# Patient Record
Sex: Female | Born: 1960 | Race: White | Hispanic: No | Marital: Married | Smoking: Never smoker
Health system: Southern US, Community
[De-identification: ages and names within clinical notes are randomized; demographics above are authoritative.]

## PROBLEM LIST (undated history)

## (undated) DIAGNOSIS — Z5189 Encounter for other specified aftercare: Secondary | ICD-10-CM

## (undated) DIAGNOSIS — M1990 Unspecified osteoarthritis, unspecified site: Secondary | ICD-10-CM

## (undated) DIAGNOSIS — I219 Acute myocardial infarction, unspecified: Secondary | ICD-10-CM

## (undated) DIAGNOSIS — T7840XA Allergy, unspecified, initial encounter: Secondary | ICD-10-CM

## (undated) DIAGNOSIS — E079 Disorder of thyroid, unspecified: Secondary | ICD-10-CM

## (undated) DIAGNOSIS — IMO0001 Reserved for inherently not codable concepts without codable children: Secondary | ICD-10-CM

## (undated) DIAGNOSIS — H269 Unspecified cataract: Secondary | ICD-10-CM

## (undated) DIAGNOSIS — I509 Heart failure, unspecified: Secondary | ICD-10-CM

## (undated) HISTORY — DX: Disorder of thyroid, unspecified: E07.9

## (undated) HISTORY — DX: Acute myocardial infarction, unspecified: I21.9

## (undated) HISTORY — DX: Unspecified osteoarthritis, unspecified site: M19.90

## (undated) HISTORY — DX: Encounter for other specified aftercare: Z51.89

## (undated) HISTORY — DX: Unspecified cataract: H26.9

## (undated) HISTORY — PX: EYE SURGERY: SHX253

## (undated) HISTORY — DX: Allergy, unspecified, initial encounter: T78.40XA

## (undated) HISTORY — DX: Heart failure, unspecified: I50.9

## (undated) HISTORY — PX: BREAST SURGERY: SHX581

## (undated) HISTORY — PX: COSMETIC SURGERY: SHX468

## (undated) HISTORY — DX: Reserved for inherently not codable concepts without codable children: IMO0001

---

## 1987-06-30 HISTORY — PX: OTHER SURGICAL HISTORY: SHX169

## 1998-06-29 HISTORY — PX: STAPEDECTOMY: SHX2435

## 2000-01-21 ENCOUNTER — Encounter (INDEPENDENT_AMBULATORY_CARE_PROVIDER_SITE_OTHER): Payer: Self-pay | Admitting: Specialist

## 2000-01-21 ENCOUNTER — Other Ambulatory Visit: Admission: RE | Admit: 2000-01-21 | Discharge: 2000-01-21 | Payer: Self-pay | Admitting: Otolaryngology

## 2001-07-21 ENCOUNTER — Other Ambulatory Visit: Admission: RE | Admit: 2001-07-21 | Discharge: 2001-07-21 | Payer: Self-pay | Admitting: Obstetrics and Gynecology

## 2002-06-15 ENCOUNTER — Other Ambulatory Visit: Admission: RE | Admit: 2002-06-15 | Discharge: 2002-06-15 | Payer: Self-pay | Admitting: Obstetrics and Gynecology

## 2002-06-29 HISTORY — PX: TUBAL LIGATION: SHX77

## 2003-06-30 DIAGNOSIS — I219 Acute myocardial infarction, unspecified: Secondary | ICD-10-CM

## 2003-06-30 HISTORY — PX: CARDIAC CATHETERIZATION: SHX172

## 2003-06-30 HISTORY — DX: Acute myocardial infarction, unspecified: I21.9

## 2004-01-02 ENCOUNTER — Inpatient Hospital Stay (HOSPITAL_COMMUNITY): Admission: EM | Admit: 2004-01-02 | Discharge: 2004-01-09 | Payer: Self-pay | Admitting: Emergency Medicine

## 2004-01-03 ENCOUNTER — Encounter (INDEPENDENT_AMBULATORY_CARE_PROVIDER_SITE_OTHER): Payer: Self-pay | Admitting: Cardiology

## 2004-02-07 ENCOUNTER — Other Ambulatory Visit: Admission: RE | Admit: 2004-02-07 | Discharge: 2004-02-07 | Payer: Self-pay | Admitting: Obstetrics and Gynecology

## 2005-03-20 ENCOUNTER — Other Ambulatory Visit: Admission: RE | Admit: 2005-03-20 | Discharge: 2005-03-20 | Payer: Self-pay | Admitting: Obstetrics and Gynecology

## 2005-06-29 HISTORY — PX: CHOLECYSTECTOMY: SHX55

## 2006-06-29 DIAGNOSIS — E079 Disorder of thyroid, unspecified: Secondary | ICD-10-CM

## 2006-06-29 HISTORY — DX: Disorder of thyroid, unspecified: E07.9

## 2006-07-12 ENCOUNTER — Ambulatory Visit (HOSPITAL_COMMUNITY): Admission: RE | Admit: 2006-07-12 | Discharge: 2006-07-13 | Payer: Self-pay | Admitting: General Surgery

## 2006-07-12 ENCOUNTER — Encounter (INDEPENDENT_AMBULATORY_CARE_PROVIDER_SITE_OTHER): Payer: Self-pay | Admitting: *Deleted

## 2007-01-25 ENCOUNTER — Ambulatory Visit (HOSPITAL_COMMUNITY): Admission: RE | Admit: 2007-01-25 | Discharge: 2007-01-25 | Payer: Self-pay | Admitting: Obstetrics and Gynecology

## 2008-05-18 ENCOUNTER — Ambulatory Visit (HOSPITAL_COMMUNITY): Admission: RE | Admit: 2008-05-18 | Discharge: 2008-05-18 | Payer: Self-pay | Admitting: Obstetrics and Gynecology

## 2008-07-11 ENCOUNTER — Other Ambulatory Visit: Admission: RE | Admit: 2008-07-11 | Discharge: 2008-07-11 | Payer: Self-pay | Admitting: Interventional Radiology

## 2008-07-11 ENCOUNTER — Encounter (INDEPENDENT_AMBULATORY_CARE_PROVIDER_SITE_OTHER): Payer: Self-pay | Admitting: Interventional Radiology

## 2008-07-11 ENCOUNTER — Encounter: Admission: RE | Admit: 2008-07-11 | Discharge: 2008-07-11 | Payer: Self-pay | Admitting: General Surgery

## 2009-05-08 ENCOUNTER — Encounter: Admission: RE | Admit: 2009-05-08 | Discharge: 2009-05-08 | Payer: Self-pay | Admitting: General Surgery

## 2009-05-22 ENCOUNTER — Ambulatory Visit: Payer: Self-pay | Admitting: Obstetrics & Gynecology

## 2009-05-22 LAB — CONVERTED CEMR LAB
ALT: 11 units/L (ref 0–35)
AST: 15 units/L (ref 0–37)
Albumin: 4.4 g/dL (ref 3.5–5.2)
Calcium: 9.5 mg/dL (ref 8.4–10.5)
Cholesterol: 218 mg/dL — ABNORMAL HIGH (ref 0–200)
HCT: 41.8 % (ref 36.0–46.0)
MCV: 91.7 fL (ref 78.0–100.0)
Total CHOL/HDL Ratio: 2.2
Triglycerides: 48 mg/dL (ref <150)
VLDL: 10 mg/dL (ref 0–40)
WBC: 7.1 10*3/uL (ref 4.0–10.5)

## 2010-01-06 ENCOUNTER — Ambulatory Visit: Payer: Self-pay | Admitting: General Surgery

## 2010-02-17 ENCOUNTER — Encounter: Admission: RE | Admit: 2010-02-17 | Discharge: 2010-02-17 | Payer: Self-pay | Admitting: General Surgery

## 2010-07-20 ENCOUNTER — Encounter: Payer: Self-pay | Admitting: Obstetrics and Gynecology

## 2010-08-04 ENCOUNTER — Other Ambulatory Visit: Payer: Self-pay | Admitting: General Surgery

## 2010-08-04 DIAGNOSIS — E049 Nontoxic goiter, unspecified: Secondary | ICD-10-CM

## 2010-08-07 ENCOUNTER — Ambulatory Visit
Admission: RE | Admit: 2010-08-07 | Discharge: 2010-08-07 | Disposition: A | Discharge status: Home / Self Care | Payer: BC Managed Care – HMO | Source: Ambulatory Visit | Attending: General Surgery | Admitting: General Surgery

## 2010-08-07 DIAGNOSIS — E049 Nontoxic goiter, unspecified: Secondary | ICD-10-CM

## 2010-11-11 NOTE — Assessment & Plan Note (Signed)
NAME:  Mary Gill, Mary Gill NO.:  [id number]   MEDICAL RECORD NO.:  [id number]          PATIENT TYPE:  POB   LOCATION:  [facility] at [facility]         FACILITY:  [facility]   PHYSICIAN:  Allie Bossier, MD        DATE OF BIRTH:  15-Nov-1960   DATE OF SERVICE:  05/22/2009                                  CLINIC NOTE   Mary Gill is a 50 year old married white gravida 1, para 1.  She has a 36-  year-old daughter, who is pregnant with her first grandchild.  She comes  in here for a GYN exam.  Her only complaint is that of decreased libido.  She says her libido does not match her husband's.  She is interested in  trying testosterone ointment.  In addition, she says she has been  skipping periods, in fact she had 3 periods last year.  She had a year's  worth of hot flashes, but these have now resolved.   PAST MEDICAL HISTORY:  She was diagnosed with a myocardial infarction in  2005.  She has not seen a doctor for this condition since approximately  2006.  She had a goiter diagnosed in 2008 and saw Dr. Kae Heller, but she  has not seen him recently.  She is supposed to follow up annual exam.   REVIEW OF SYSTEMS:  As above.  Remainder of review of systems questions  are negative.  Her mammogram and Pap smear both done in 2009, and she  has not had a colonoscopy.   MEDICATIONS:  None.   PAST SURGICAL HISTORY:  She had a cardiac cath, cholecystectomy, tubal  ligation, and breast reduction.   SOCIAL HISTORY:  She drinks alcohol socially, approximately 4-5 times a  week, but no known drugs or tobacco.   ALLERGIES:  No known drug allergies.  No latex allergies.   FAMILY HISTORY:  Positive for a maternal aunt having breast cancer,  first diagnosed this was premenopausal and the second diagnosed breast  cancer was postmenopausal.   PHYSICAL EXAMINATION:  VITAL SIGNS:  Weight 185, blood pressure 124/89,  pulse 65.  HEENT:  Normal.  HEART:  Regular rate and rhythm.  BREASTS:  Normal  bilaterally.  LUNGS:  Clear to auscultation bilaterally.  ABDOMEN:  Benign.  No palpable hepatosplenomegaly.  PELVIC:  External genitalia, no lesions.  Cervix, no abnormalities.  Uterus, normal size and shape, anteverted.  Adnexa, nontender, no  masses.   ASSESSMENT AND PLAN:  Annual exam.  Checked Pap smear.  Recommended self-  breast and self-vulvar exams and I will schedule a mammogram.  With  regard to her history of goiter, I am checking a TSH today.  With regard  to  her MI, I am checking a CMET and fasting lipids.  With regard to her  decreased libido, I have given her 2% testosterone ointment to be used 3  times a week.      Allie Bossier, MD     MCD/MEDQ  D:  05/22/2009  T:  05/23/2009  Job:  [id number]

## 2010-11-14 IMAGING — US US SOFT TISSUE HEAD/NECK
1 series · 13 of 25 positions shown · non-contrast
Comparison: Thyroid ultrasound 01/25/2007.

CLINICAL DATA: Follow up multinodular goiter.

THYROID ULTRASOUND 05/18/2008:
TECHNIQUE: Ultrasound examination of the thyroid gland and
adjacent soft tissues was performed.

[Series 1: unknown · 0.09mm/px · 13 of 48 slices shown]
[im 1/48]
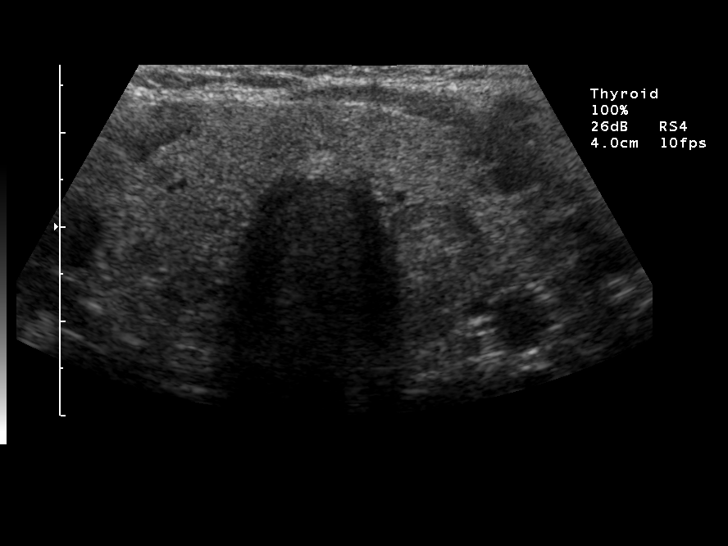
[im 4/48]
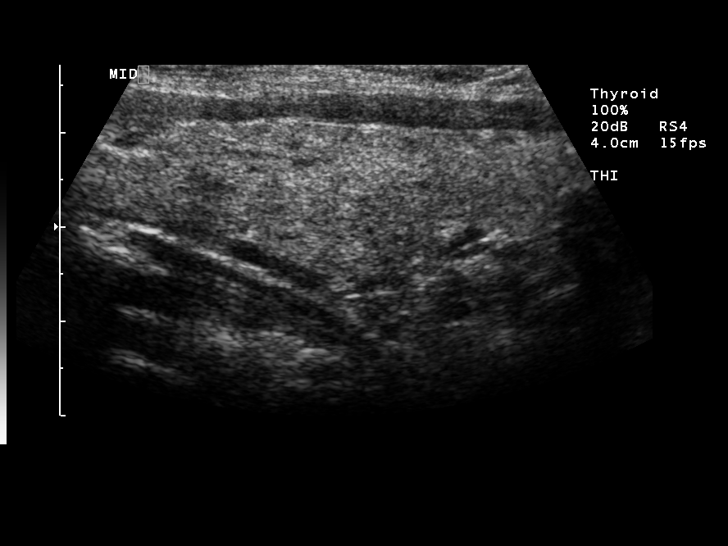
[im 8/48]
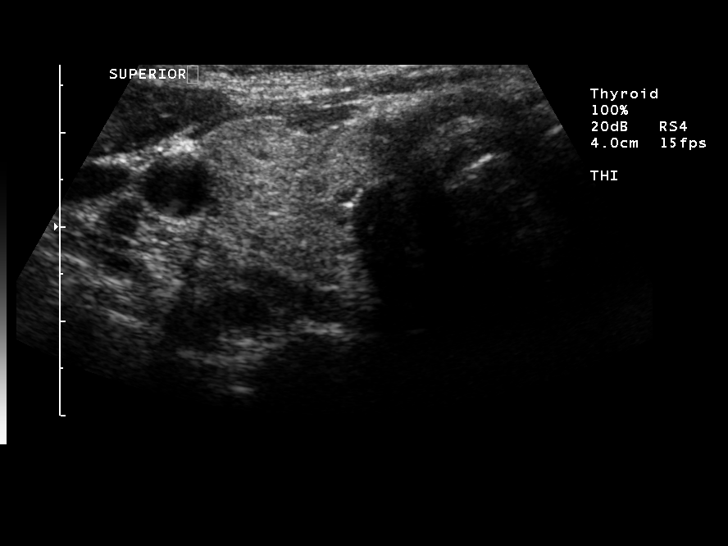
[im 12/48]
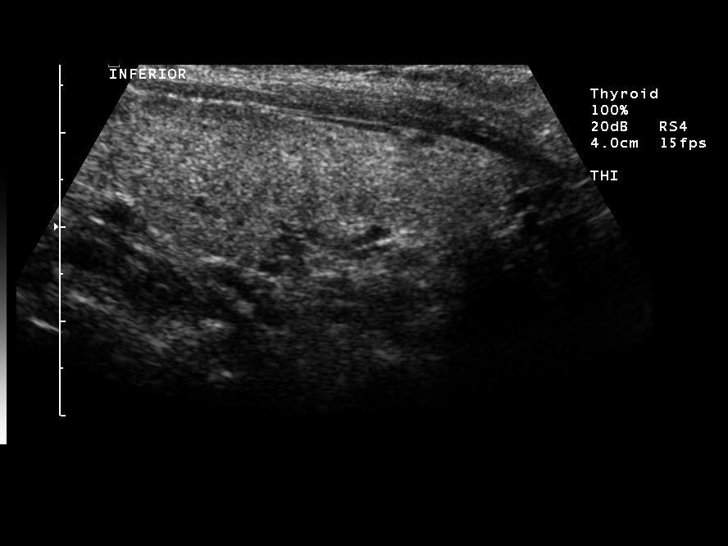
[im 16/48]
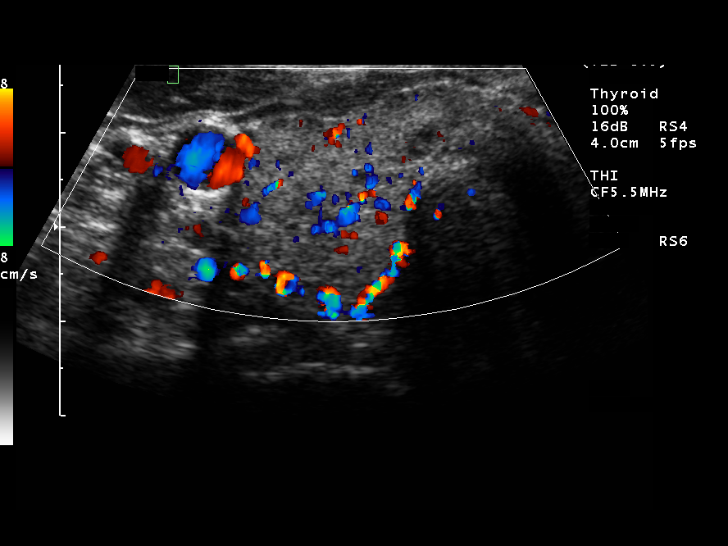
[im 20/48]
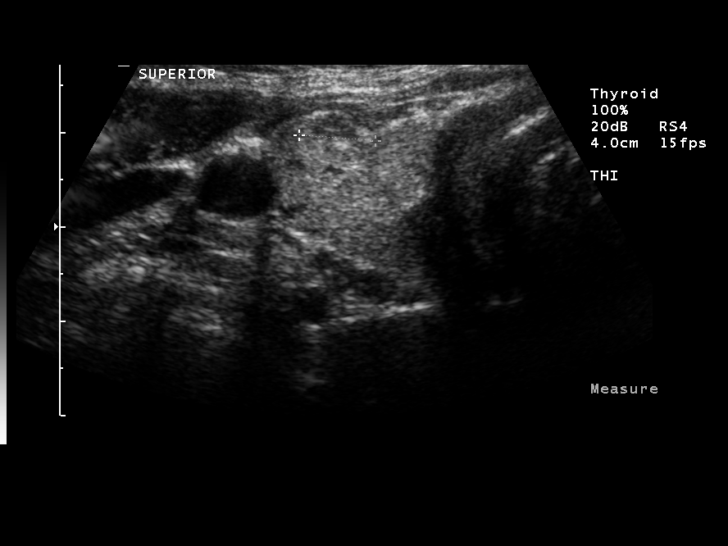
[im 24/48]
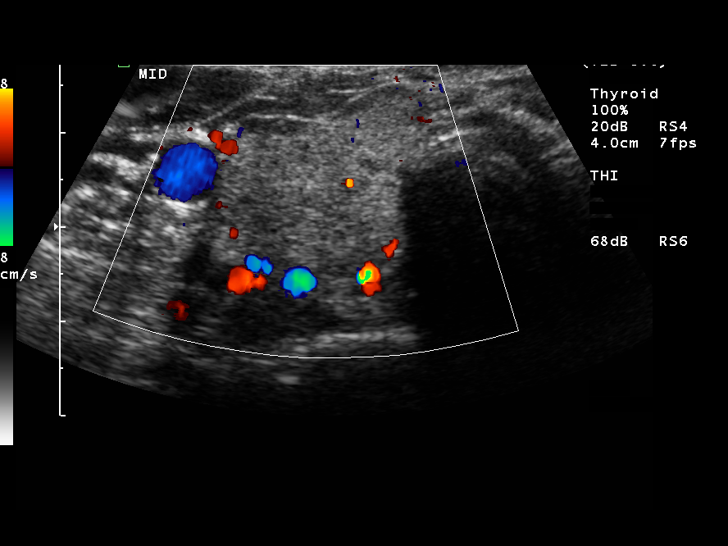
[im 28/48]
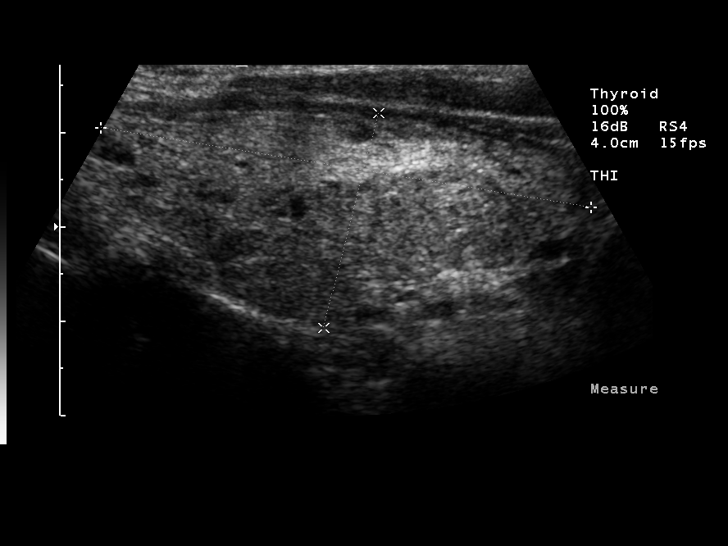
[im 32/48]
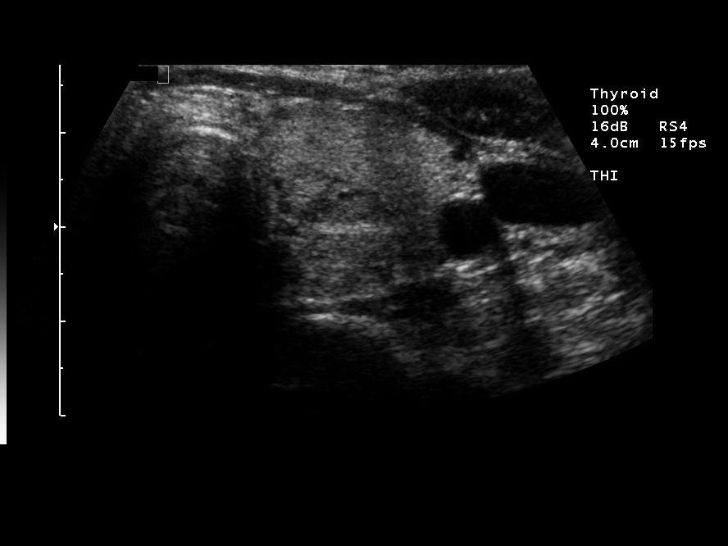
[im 36/48]
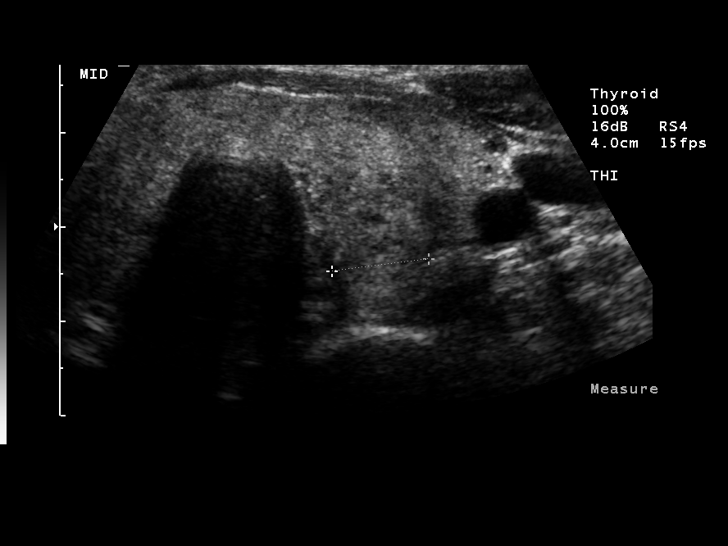
[im 40/48]
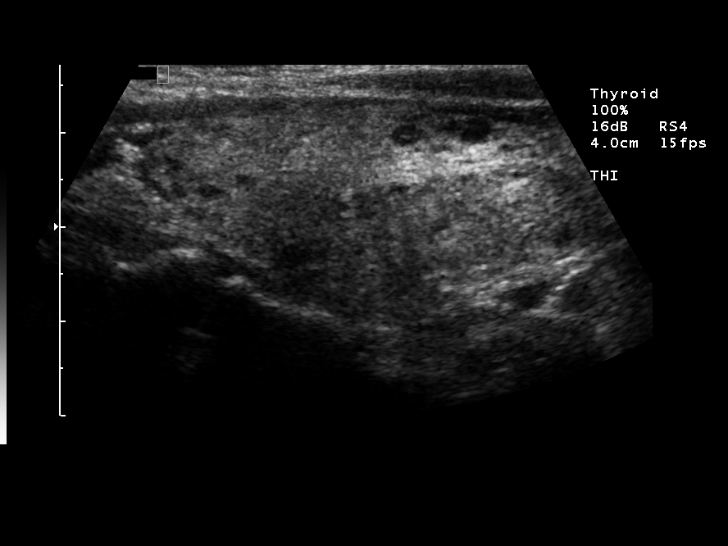
[im 44/48]
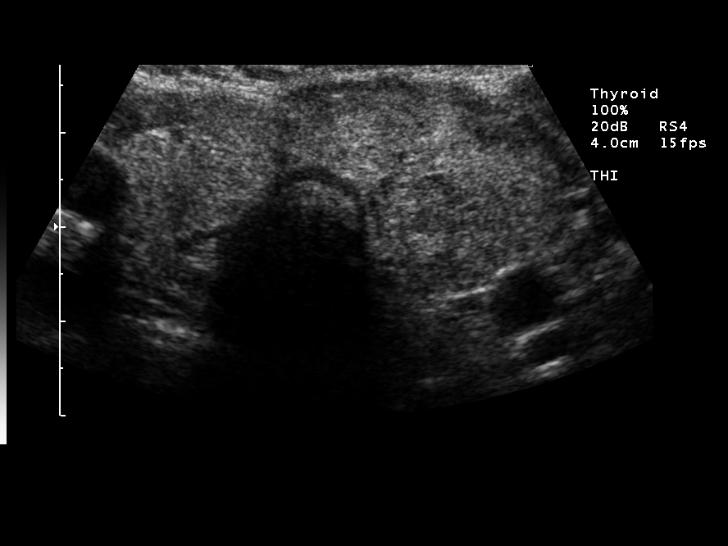
[im 48/48]
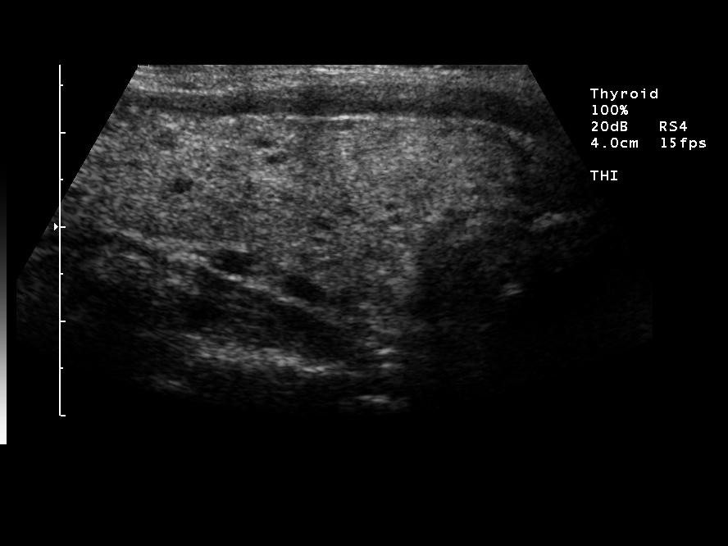

[13 of 25 positions shown; findings below may reference images not displayed]

FINDINGS: Both lobes of the thyroid gland are enlarged with
diffusely heterogeneous echotexture, and have increased slightly in
size since the previous examination; the right lobe now measuring
approximately 6.0 x 2.1 x 2.2 cm and the left lobe now measuring
approximately 5.3 x 2.4 x 2.2 cm.  The isthmus is enlarged
measuring 0.8 cm in thickness.

The largest nodule arising from the lower pole of the left lobe of
the gland has increased slightly in size since the prior study,
currently measuring approximately 2.3 x 1.2 x 1.5 cm (previously
2.0 x 1.0 x 1.3 cm).  An adjacent smaller nodule in the lower pole
of the left lobe is unchanged, currently measuring 1.4 x 0.9 x
cm.  A third measurable nodule in the upper pole of the right lobe
is unchanged, currently measuring approximately 0.8 x 0.6 x 0.8 cm.
No new nodules are identified.
IMPRESSION: 1.  Interval slight increase in size of a dominant left lower pole
thyroid nodule since the previous ultrasound from December 2006.
While this is still statistically likely benign, its interval
growth might lead one to consider biopsy.  Nuclear medicine thyroid
imaging might also be considered to determine if this is a
hyperfunctioning (cold) nodule which would prompt biopsy.
2.  Slight overall increase in size of the thyroid gland since the
prior ultrasound.
3.  2 other discrete thyroid nodules stable since the prior
ultrasound.

## 2010-11-14 NOTE — Consult Note (Signed)
NAMEMarland Gill  PASHA, GADISON                          ACCOUNT NO.:  [id number]   MEDICAL RECORD NO.:  [id number]                   PATIENT TYPE:  INP   LOCATION:  2907                                 FACILITY:  [facility]   PHYSICIAN:  Gabrielle Dare. Janee Morn, M.D.             DATE OF BIRTH:  01/29/1961   DATE OF CONSULTATION:  01/03/2004  DATE OF DISCHARGE:                                   CONSULTATION   REASON FOR CONSULTATION:  Retroperitoneal hematoma.   HISTORY OF PRESENT ILLNESS:  The patient is a 50 year old white female who  was admitted January 02, 2004, with an acute myocardial infarction.  She  subsequently underwent cardiac catheterization via her right groin. The  patient developed a right groin hematoma afterwards and had an episode of  decreased blood pressure. CT scan of the abdomen and pelvis was done showing  a right groin and right prevesical pelvic hematoma extending into the right  retroperitoneum with a small amount of hemoperitoneum.  The patient's  hemoglobin has been in the 10 range the past three times it was checked.  She complains of some lower abdominal pain.  There were no other complaints.   PAST MEDICAL HISTORY:  Negative.   PAST SURGICAL HISTORY:  1. Stapedectomy.  2. Breast reduction bilaterally.  3. Tubal ligation.   FAMILY HISTORY:  Brother has a heart murmur.  Father has Alzheimer's  disease.  Her mother is alive and well.   SOCIAL HISTORY:  She is married.  She does not smoke.   CURRENT MEDICATIONS:  None.   ALLERGIES:  No known drug allergies.   REVIEW OF SYMPTOMS:  Negative.  CARDIAC:  No current chest pain.  PULMONARY:  Negative.  GI:  Negative except for the lower abdominal pain as described  above.  GU:  Negative.  MUSCULOSKELETAL:  See above.   PHYSICAL EXAMINATION:  GENERAL APPEARANCE:  She is awake and alert.  VITAL SIGNS:  Temperature 98.7, blood pressure 95/50, heart rate 78,  respiratory rate 18.  HEENT:  Pupils are equal and sclera is  nonicteric.  NECK:  Supple.  LUNGS:  Clear to auscultation bilaterally with normal respiratory excursion.  CARDIOVASCULAR:  Regular rate and rhythm.  PMI is palpable in the left  chest.  ABDOMEN:  Nondistended and soft.  She has some tenderness in the right lower  quadrant medially and in the suprapubic area with no voluntary guarding.  No  peritoneal signs are present.  No organomegaly is noted.  SKIN:  Warm, dry and intact.  EXTREMITIES:  No significant edema.  Her right groin has a pressure dressing  on it with some hematoma present.  She has palpable right dorsalis pedis  pulse in her foot and her foot is warm.   LABORATORY DATA:  Hemoglobin 10.6, platelets 301 which is up slightly from  previous.  INR is 1.0.   CT scan findings are as described above.  IMPRESSION:  Right groin and retroperitoneal hematoma, status post cardiac  catheterization.  The patient has no coagulopathy.   RECOMMENDATIONS:  I agree with following up the patient's hemoglobin  serially.  There is no acute surgical intervention needed at this time.  I  feel we should avoid opening the hematoma and releasing the tamponade being  provided by the surrounding tissues.  We will follow her closely and I feel  that very likely this hematoma should stop expanding and resorb on its own.                                               [name], M.D.    BET/MEDQ  D:  01/03/2004  T:  01/03/2004  Job:  [id number]

## 2010-11-14 NOTE — Discharge Summary (Signed)
NAMEMarland Kitchen  Mary Gill, Mary Gill                          ACCOUNT NO.:  [id number]   MEDICAL RECORD NO.:  [id number]                   PATIENT TYPE:  INP   LOCATION:  2010                                 FACILITY:  [facility]   PHYSICIAN:  Darlin Priestly, M.D.             DATE OF BIRTH:  1960-10-07   DATE OF ADMISSION:  01/02/2004  DATE OF DISCHARGE:  01/09/2004                                 DISCHARGE SUMMARY   DISCHARGE DIAGNOSES:  1. Coronary artery disease, status post acute myocardial infarction during     this admission--the patient had a catheterization without any     intervention at this time.  2. Status post catheterization retroperitoneal hematoma.  No surgical     therapy was recommended at this time, followed by surgeons.  3. Hypertension.  4. Fever, resolved.  5. Anemia, stable.  6. Favorable lipid profile.  7. Mitral valve prolapse with mitral regurgitation, requires antibiotic     prophylaxis prior to dental procedures.   HISTORY OF PRESENT ILLNESS/HOSPITAL COURSE:  This is a 50 year old Caucasian  female patient of Dr. Uvaldo Rising who had no significant cardiac history and  presented to the emergency room with substernal chest pain.  The pain  started on the evening of July 5 and lasted approximately one hour until she  fell asleep but she woke up approximately at 5 o'clock in the morning on  July 6 with recurrent chest pain radiating to her left arm and persisted  throughout the day.  Eventually, the patient presented to the emergency room  at [facility] at 4 p.m. on July 6 and EKG showed anterolateral Q  waves and cardiac markers were positive.  The patient with ongoing chest  pain was urgently taken to the catheterization lab for urgent cardiac  catheterization.   HOSPITAL PROCEDURES:  Cardiac catheterization was performed by Dr. Jenne Campus  on January 02, 2004.  It showed significant one-vessel coronary artery disease  involving the terminal portion of the distal first  obtuse marginal branch.  Ejection fraction was low normal with inferior and apical hypokinesis.  She  also had mitral valve prolapse with mild to moderate mitral regurgitation  but no evidence of aortic dissection or aortic regurgitation.   The patient tolerated the procedure well and was transferred in stable  condition to telemetry floor but later that same evening she developed  hypotension and right groin swelling and pain.  She was evaluated by Dr.  Alanda Amass.  Hemoglobin dropped from 15 on admission to 10.9.  Retroperitoneal bleed was suspected and the patient underwent CT of the  abdomen which confirmed the diagnosis of retroperitoneal bleed.   HOSPITAL CONSULTATIONS:  Surgical consult was requested and the patient was  seen on January 03, 2004 who felt like she did not need any surgery for hematoma  evacuation at the present time.  Hemoglobin stabilized and the hematoma was  not growing in size.  The decision was made just to continue observation and  should hemoglobin drop further or the patient develops active hypotension  and the patient develops groin pain or more evidence of hypotension then  probably surgery will be recommended at that time.   She also underwent right groin ultrasound that was negative for  pseudoaneurysm or AV fistula.   At the time when the surgeons assessed the patient, her hemoglobin was low  at 8.3.  She was given 2 units of packed red blood cell transfusion and it  increased her hemoglobin to 10.9 and 11.1 at the time of discharge.  She was  ambulating in the hall with no difficulties and on January 08, 2004 she was  assessed by Dr. Elsie Lincoln and was deemed to be stable for discharge home except  her fever and a urinalysis was ordered.  Chest x-ray did not show any lung  disease and the decision was made to wait the next 24 hours for the results  of urinalysis.  The patient was empirically started on Septra for complaints  of dysuria and also on Pyridium.   The next day, January 09, 2004, she was  assessed by Dr. Jacinto Halim, did not have any complaints, dysuria resolved.  She  was slightly hypotensive.  Her blood pressure manually was 102/70 but she  ambulated down the hall with no difficulties and having recurrent chest  pain, did not have any  problems with ambulation because of the groin  discomfort.   HOSPITAL LABORATORIES:  Hemoglobin at the time of discharge was 11.1,  hematocrit 32.3, white blood cell count 12.8, platelets 314.  Sodium 136,  potassium 4.1, CO2 26, chloride 104, BUN 15, creatinine 108, glucose 101.  The first urine culture showed multiple species, probable contamination, and  no uropathogens and the second specimen was collected that showed no growth.   A chest x-ray did not show any active process, no acute lung disease, and  her fever resolved.   DISCHARGE MEDICATIONS:  1. Toprol XL 25 mg 1/2 pill daily.  2. Altace 2.5 mg 1 capsule daily.  3. Zocor 20 mg 1 pill daily.  4. Aspirin 81 mg q.d.   ACTIVITY:  No driving, no heavy lifting greater than 5 pounds, and no  strenuous activity for 72 hours post discharge.  The patient also was  instructed to continue without any exertional activity up until seen in the  office by Dr. Jenne Campus and instructed to report any increased pain, swelling,  or redness of the groin or abdominal discomfort to our office, phone number  provided.  Dr. Jenne Campus will see the patient on August 3 at 11 o'clock.   Dr. Jacinto Halim during the pre-discharge assessment of the patient expressed  suspicion that the myocardial infarction in this menstruating young female  could be caused by spontaneous dissection of a small coronary vessel and  despite her relative hypotension decision was made to continue with a low  dose of beta-blocker therapy, low dose of ACE inhibitor, and a low dose of  statin agent.  As mentioned above, she will be assessed by Dr. Jenne Campus in August after discharge from the hospital and all  necessary recommendations  will be made at that time.      Raymon Mutton, P.A.                    Darlin Priestly, M.D.    MK/MEDQ  D:  01/09/2004  T:  01/09/2004  Job:  [id number]  cc:   [facility]   Dr, Uvaldo Rising

## 2010-11-14 NOTE — Cardiovascular Report (Signed)
NAME:  Mary Gill, Mary Gill                          ACCOUNT NO.:  [id number]   MEDICAL RECORD NO.:  [id number]                   PATIENT TYPE:  INP   LOCATION:  1823                                 FACILITY:  [facility]   PHYSICIAN:  Darlin Priestly, M.D.             DATE OF BIRTH:  04-Apr-1961   DATE OF PROCEDURE:  01/02/2004  DATE OF DISCHARGE:                              CARDIAC CATHETERIZATION   PROCEDURES:  1. Left heart catheterization.  2. Coronary angiography.  3. Left ventriculogram.  4. Ascending aortography.   OPERATOR:  Darlin Priestly, M.D.   COMPLICATIONS:  None.   INDICATIONS:  [name] is a 50 year old female patient of Dr. Corliss Blacker  with no significant prior cardiac history.  The patient developed substernal  chest pain on the evening of January 01, 2004, which lasted approximately one  hour until she fell asleep.  She awoke at approximately 0500 on January 02, 2004, with recurrent chest pain radiating to her left arm, which persisted  throughout the day.  She ultimately presented to the ER at approximately 4  p.m. on January 02, 2004, with inferolateral Q-waves, ongoing chest pain, and  positive markers.  She is now brought for urgent catheterization to assess  her coronary status.   DESCRIPTION OF PROCEDURE:  After getting informed written consent, the  patient was brought to the cardiac catheterization lab.  The right and left  groin were shaved, prepped and draped in the usual sterile fashion.  Anesthesia monitoring established.  Using modified Seldinger technique, a #7  French arterial sheath was inserted in the right femoral artery.  A 5 French  venous sheath was inserted in the right femoral vein.  Six French diagnostic  catheters then used to perform diagnostic angiography.   1. There was a large left main with no significant disease.  2. The LAD is a large vessel that coursed to the apex with no significant     disease.  3. The left coronary ostium gives rise to a  large ramus intermedius, which     bifurcates in the midsegment and has no significant disease.  4. Left circumflex is a large vessel that coursed to the AV groove and gives     rise to one large obtuse marginal branch.  The AV circumflex has no     significant disease.  The first OM was a large vessel which bifurcates in     the midsegment into an upper and lower branch.  There is no significant     disease in the proximal portion of the OM.  The upper bifurcation appears     normal.  The lower bifurcation appears to have a 99% totally-occluded     distal aspect with very faint bridging collaterals to a very small distal     portion of the OM.  5. The right coronary artery is a large vessel that is dominant.  It gives     rise to a PDA as well as a posterolateral branch.  There is no     significant disease in the RCA, PDA, or posterolateral branch.  6. Left ventriculogram reveals a low normal EF of 50% with inferior and     apical hypokinesis.  7. There does appear to be mitral valve prolapse with mild to moderate     mitral regurgitation.  8. Ascending aortography reveals no evidence of dissection or aortic     regurgitation.   HEMODYNAMICS:  Systemic arterial pressure 111/73, LV __________ pressure  102/9, LVEDP of 21.   CONCLUSION:  1. Significant one-vessel coronary artery disease involving the terminal     portion of the distal first obtuse marginal.  2. Low normal ejection fraction with inferior and apical hypokinesis.  3. Mitral valve prolapse with mild to moderate mitral regurgitation.  4. No evidence of aortic dissection or aortic regurgitation.                                               Darlin Priestly, M.D.    RHM/MEDQ  D:  01/02/2004  T:  01/03/2004  Job:  [id number]   cc:   Pam Drown, M.D.  [address]  [place]  [place] [address]  Fax: [phone]

## 2010-11-14 NOTE — Op Note (Signed)
NAMESEPTEMBER, MORMILE                ACCOUNT NO.:  [id number]   MEDICAL RECORD NO.:  [id number]          PATIENT TYPE:  AMB   LOCATION:  SDS                          FACILITY:  [facility]   PHYSICIAN:  Adolph Pollack, M.D.DATE OF BIRTH:  08-07-1960   DATE OF PROCEDURE:  07/12/2006  DATE OF DISCHARGE:                               OPERATIVE REPORT   PREOPERATIVE DIAGNOSIS:  Symptomatic cholelithiasis.   POSTOPERATIVE DIAGNOSIS:  Symptomatic cholelithiasis.   PROCEDURE:  Laparoscopic cholecystectomy with intraoperative  cholangiogram.   SURGEON:  Adolph Pollack, M.D.   ASSISTANT:  Leonie Man, MD   ANESTHESIA:  General.   INDICATION:  Ms. Faddis is a 50 year old female who has had two bouts  of biliary colic, one being quite severe.  She had an abdominal  ultrasound which demonstrated cholelithiasis and a mildly distended  gallbladder.  The common bile duct was reported be upper limits of  normal in size.  She now presents for elective cholecystectomy.  We have  discussed the procedure and risks preoperatively.   TECHNIQUE:  She is seen in the holding area and brought to the operating  room, placed supine on the operating table.  General anesthetic was  administered.  The abdominal wall was sterilely prepped and draped.  Dilute Marcaine solution was infiltrated in the subumbilical area.  A  small subumbilical incision was made dividing skin, subcutaneous tissue,  fascia and peritoneum entering the peritoneal cavity.  A pursestring  suture of 0 Vicryl was placed around the fascial edges.  A Hassan trocar  was introduced into the peritoneal cavity and pneumoperitoneum created  by insufflation of CO2 gas.   The laparoscope was introduced.  She is placed in reverse Trendelenburg  position.  The right side tilted slightly upward.  An 11-mm trocar was  placed through an epigastric incision and two 5 mm trocars placed in the  right mid lateral abdomen.  The fundus of the  gallbladder was grasped  and retracted toward the right shoulder.  The infundibulum was grasped  and mobilized by using careful dissection close to the gallbladder.  The  cystic duct was identified and a window created around it.  The cystic  artery was identified and a window created around it as well.  A clip  was placed in the cystic duct gallbladder junction.  An incision was  made in the cystic duct and bile milked back.  The cholangiocatheter was  then passed through the anterior abdominal wall and placed into the  cystic duct and a cholangiogram was performed.   Under real time fluoroscopy dilute contrast material was injected to the  cystic duct which was mild moderate to long length.  The common  hepatic/right left hepatic/common bile ducts all opacified and contrast  drained into the duodenum rapidly without obvious evidence of  obstruction.  Final reports pending radiologist's interpretation.   Cholangiocath was removed, the cystic duct was clipped three times  proximally and divided.  The cystic artery was then clipped and divided.  The gallbladder was then dissected free from the liver using  electrocautery and placed in  Endopouch bag.  The gallbladder fossa was  copiously irrigated.  No bleeding or bile leak was noted.  The  irrigation fluid was evacuated as much as possible.   The gallbladder was then removed through the subumbilical incision and  the subumbilical fascial defect was closed under laparoscopic vision by  tightening up and tying down the pursestring suture.  The remaining  trocars removed and the pneumoperitoneum released.  The skin incisions  were closed with 4-0 Monocryl subcuticular stitches followed by Steri-  Strips and sterile dressing.   She tolerated the procedure without any apparent complications and was  taken to recovery in satisfactory condition.      Adolph Pollack, M.D.  Electronically Signed     TJR/MEDQ  D:  07/12/2006  T:   07/12/2006  Job:  [id number]   cc:   Darlin Priestly, MD

## 2011-10-15 ENCOUNTER — Ambulatory Visit (INDEPENDENT_AMBULATORY_CARE_PROVIDER_SITE_OTHER): Payer: BC Managed Care – HMO | Admitting: Obstetrics & Gynecology

## 2011-10-15 ENCOUNTER — Encounter: Payer: Self-pay | Admitting: Obstetrics & Gynecology

## 2011-10-15 VITALS — BP 111/68 | HR 69 | Ht 66.0 in | Wt 180.0 lb

## 2011-10-15 DIAGNOSIS — Z124 Encounter for screening for malignant neoplasm of cervix: Secondary | ICD-10-CM

## 2011-10-15 DIAGNOSIS — Z113 Encounter for screening for infections with a predominantly sexual mode of transmission: Secondary | ICD-10-CM

## 2011-10-15 DIAGNOSIS — Z0000 Encounter for general adult medical examination without abnormal findings: Secondary | ICD-10-CM

## 2011-10-15 DIAGNOSIS — Z01419 Encounter for gynecological examination (general) (routine) without abnormal findings: Secondary | ICD-10-CM

## 2011-10-15 MED ORDER — ESTROGENS, CONJUGATED 0.625 MG/GM VA CREA: TOPICAL_CREAM | VAGINAL | Status: DC

## 2011-10-15 NOTE — Progress Notes (Signed)
Subjective:    Mary Gill is a 51 y.o. female who presents for an annual exam. The patient has no complaints today. The patient is sexually active. GYN screening history: last pap: was normal. The patient wears seatbelts: yes. The patient participates in regular exercise: yes. Has the patient ever been transfused or tattooed?: yes. The patient reports that there is not domestic violence in her life.   Menstrual History: OB History    Grav Para Term Preterm Abortions TAB SAB Ect Mult Living   1 1 1       1       Menarche age: 64 No LMP recorded. Patient is postmenopausal.    The following portions of the patient's history were reviewed and updated as appropriate: allergies, current medications, past family history, past medical history, past social history, past surgical history and problem list.  Review of Systems A comprehensive review of systems was negative. Her mammogram was normal recently. She would like her fasting labs done today.   Objective:    BP 111/68  Pulse 69  Ht 5\' 6"  (1.676 m)  Wt 180 lb (81.647 kg)  BMI 29.05 kg/m2  General Appearance:    Alert, cooperative, no distress, appears stated age  Head:    Normocephalic, without obvious abnormality, atraumatic  Eyes:    PERRL, conjunctiva/corneas clear, EOM's intact, fundi    benign, both eyes  Ears:    Normal TM's and external ear canals, both ears  Nose:   Nares normal, septum midline, mucosa normal, no drainage    or sinus tenderness  Throat:   Lips, mucosa, and tongue normal; teeth and gums normal  Neck:   Supple, symmetrical, trachea midline, no adenopathy;    thyroid:  no enlargement/tenderness/nodules; no carotid   bruit or JVD  Back:     Symmetric, no curvature, ROM normal, no CVA tenderness  Lungs:     Clear to auscultation bilaterally, respirations unlabored  Chest Wall:    No tenderness or deformity   Heart:    Regular rate and rhythm, S1 and S2 normal, no murmur, rub   or gallop  Breast Exam:    No  tenderness, masses, or nipple abnormality  Abdomen:     Soft, non-tender, bowel sounds active all four quadrants,    no masses, no organomegaly  Genitalia:    Normal female without lesion, discharge or tenderness, moderate atrophy, NSSA, NT, no adnexal masses     Extremities:   Extremities normal, atraumatic, no cyanosis or edema  Pulses:   2+ and symmetric all extremities  Skin:   Skin color, texture, turgor normal, no rashes or lesions  Lymph nodes:   Cervical, supraclavicular, and axillary nodes normal  Neurologic:   CNII-XII intact, normal strength, sensation and reflexes    throughout  .    Assessment:    Healthy female exam.  Atrophic vaginitis.   Plan:     Pap smear.  Labs as requested. Vaginal estrogen 2-3 times per week

## 2011-10-16 LAB — COMPREHENSIVE METABOLIC PANEL
Albumin: 4.3 g/dL (ref 3.5–5.2)
Alkaline Phosphatase: 77 U/L (ref 39–117)
CO2: 28 mEq/L (ref 19–32)
Glucose, Bld: 80 mg/dL (ref 70–99)
Potassium: 4.3 mEq/L (ref 3.5–5.3)
Sodium: 143 mEq/L (ref 135–145)
Total Bilirubin: 0.5 mg/dL (ref 0.3–1.2)

## 2011-10-16 LAB — TSH: TSH: 1.867 u[IU]/mL (ref 0.350–4.500)

## 2011-10-16 LAB — LIPID PANEL
Cholesterol: 171 mg/dL (ref 0–200)
LDL Cholesterol: 82 mg/dL (ref 0–99)
VLDL: 10 mg/dL (ref 0–40)

## 2012-04-14 ENCOUNTER — Encounter: Payer: Self-pay | Admitting: Obstetrics & Gynecology

## 2012-07-04 IMAGING — US US THYROID
1 series · 17 of 25 positions shown · non-contrast
Comparison: none

REASON FOR EXAM: multinodular thyroid goiter
COMMENTS:

[Series 1: us thyroid · 17 of 104 slices shown]
[im 1/104]
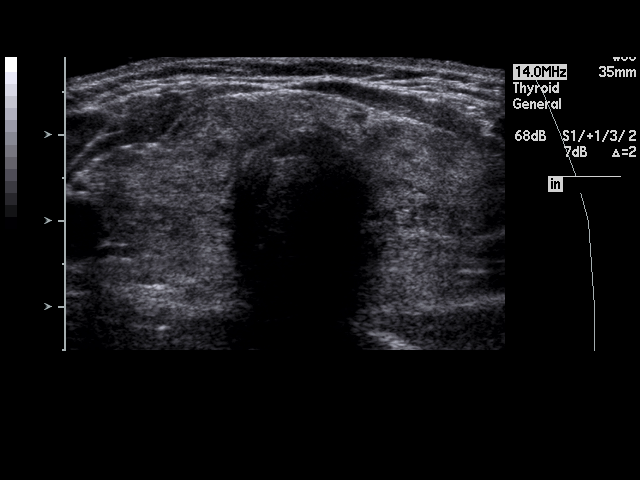
[im 9/104]
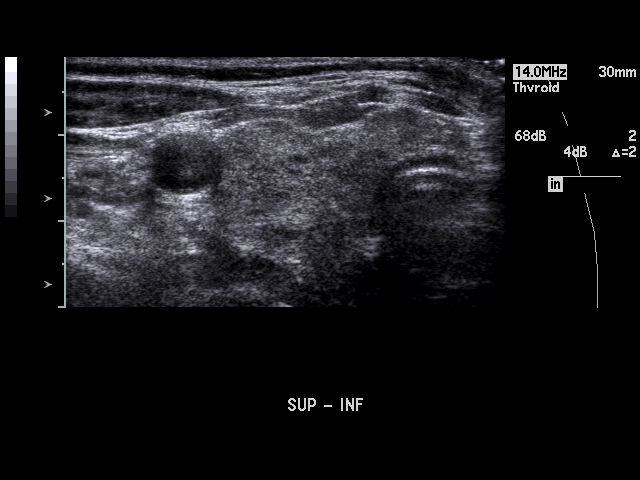
[im 13/104]
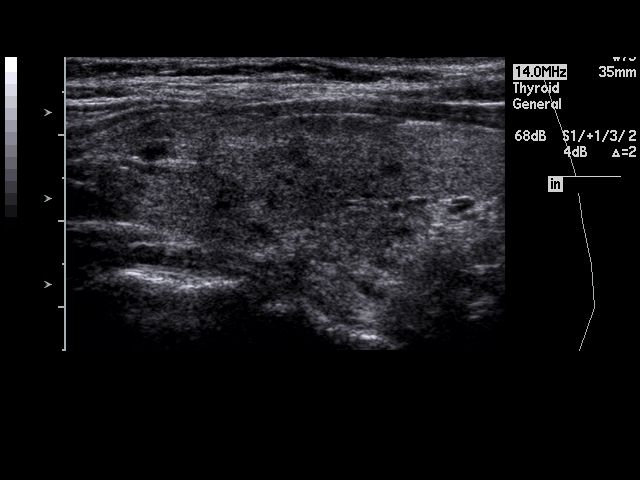
[im 22/104]
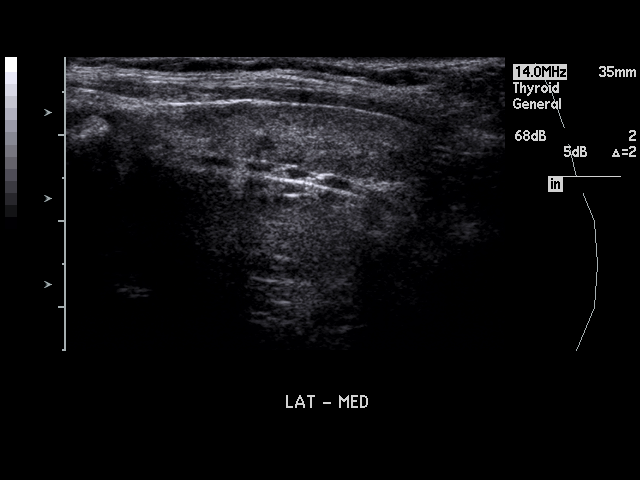
[im 26/104]
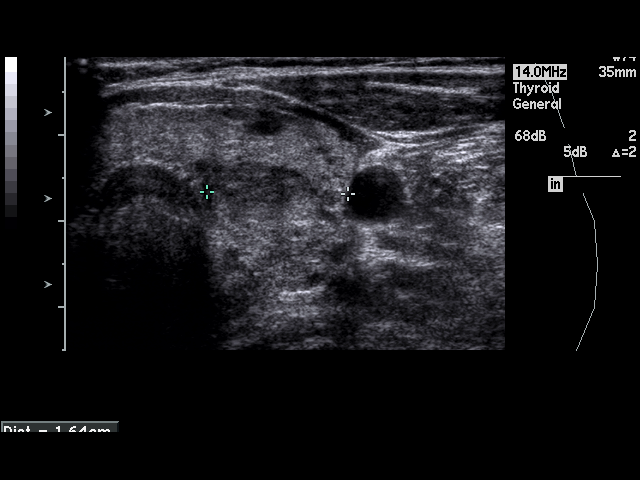
[im 35/104]
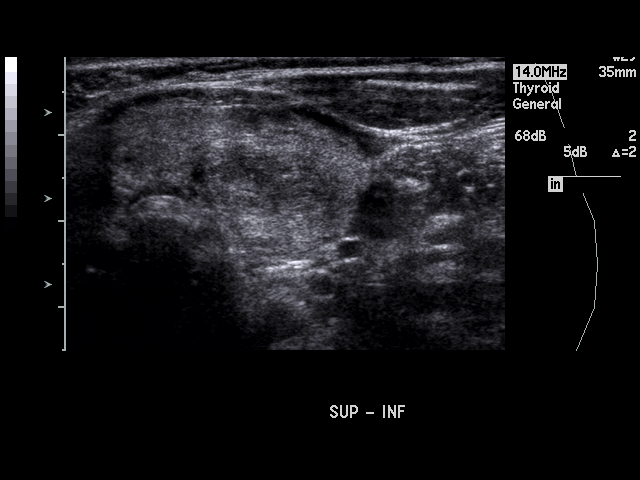
[im 39/104]
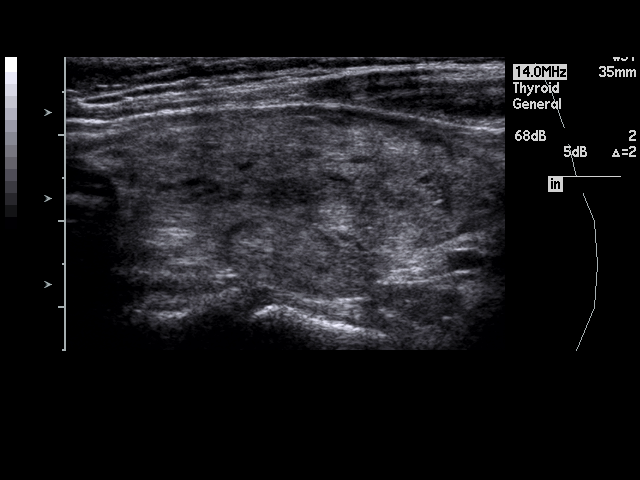
[im 48/104]
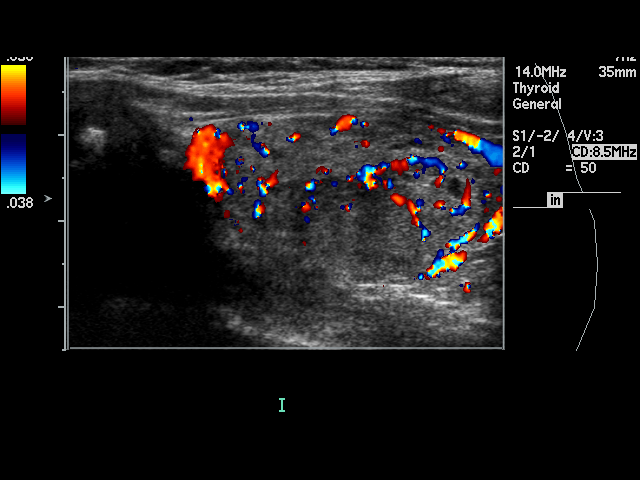
[im 52/104]
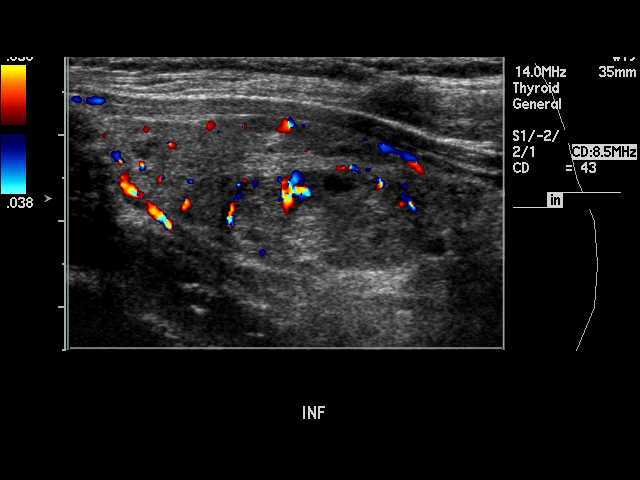
[im 56/104]
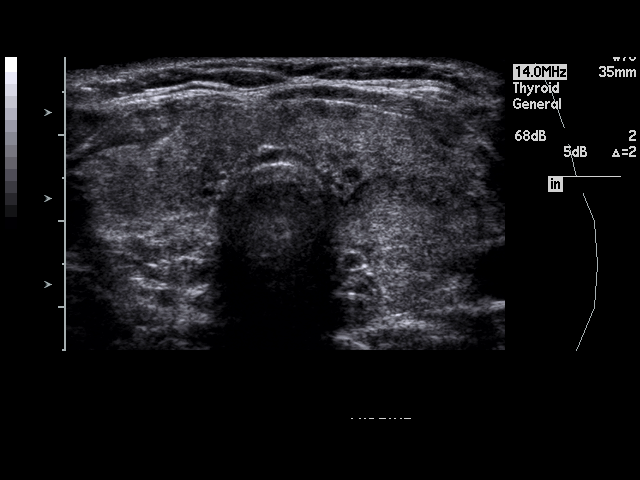
[im 65/104]
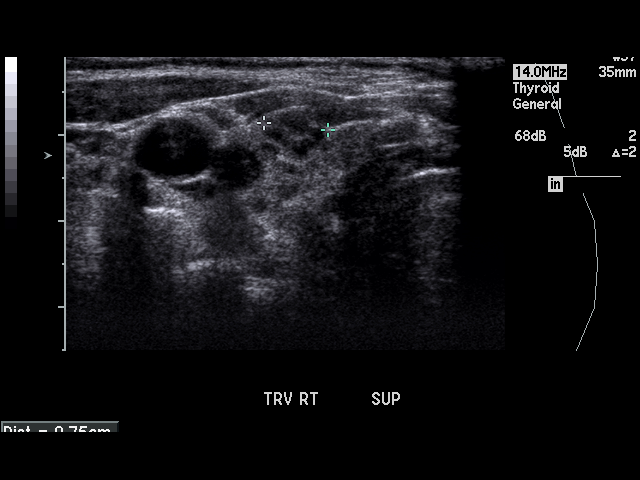
[im 69/104]
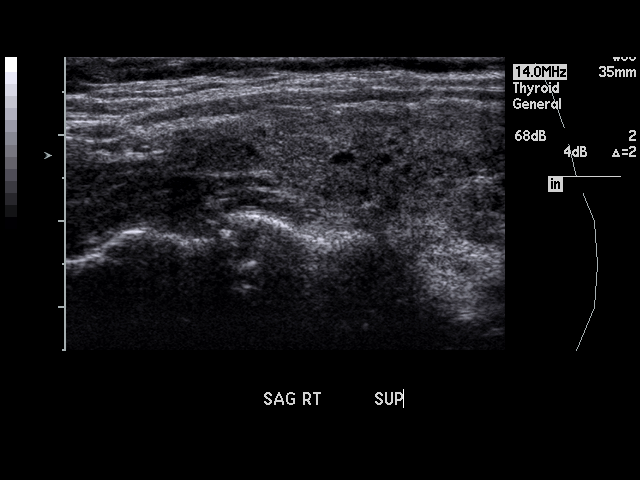
[im 78/104]
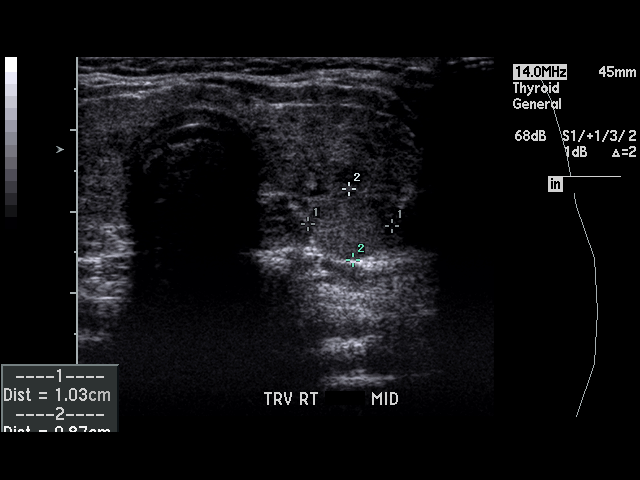
[im 82/104]
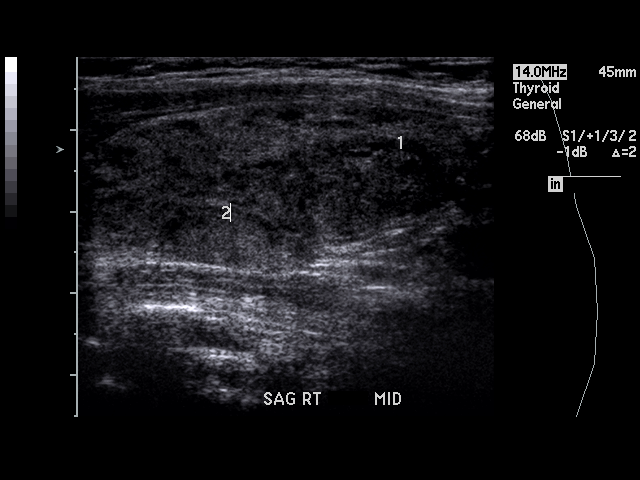
[im 91/104]
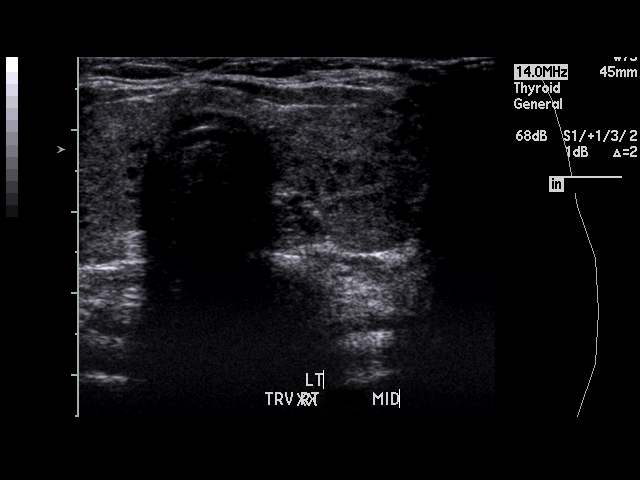
[im 95/104]
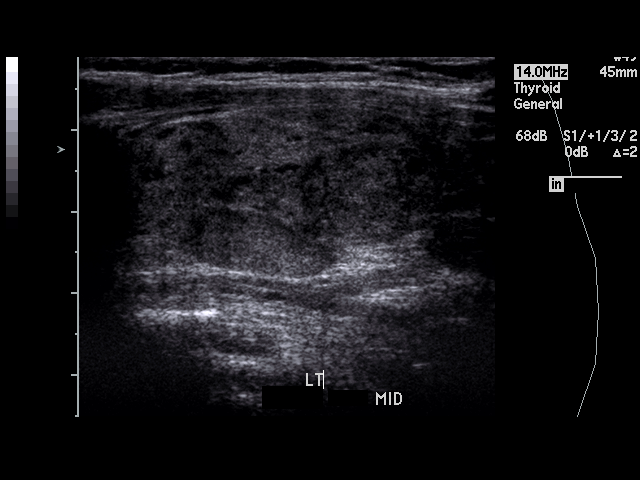
[im 104/104]
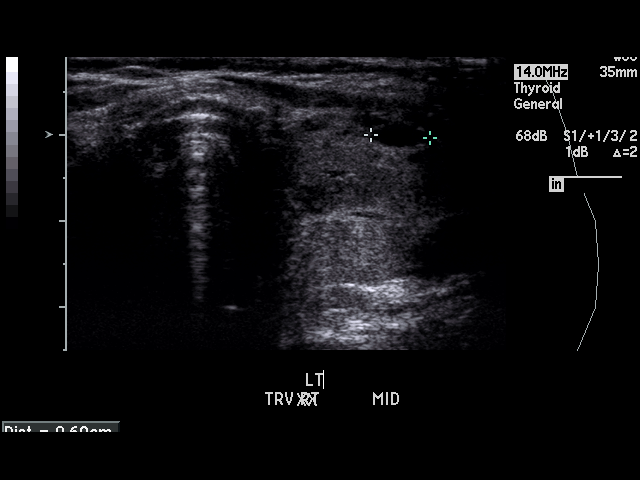

[17 of 25 positions shown; findings below may reference images not displayed]

PROCEDURE:     US  - US THYROID  - January 06, 2010  [DATE]

RESULT:     The right lobe of the thyroid measures 4.96 cm x 1.85 cm x
cm and the left lobe measures 4.4 cm x 1.73 cm x 1.64 cm. On the right,
there are three nodules. The most superior measures 1 cm in diameter and is
a complex nodule containing hypoechoic and cystic components. There is also
an 8.1 mm, mildly hypoechoic, solid nodule at the upper pole of the right
node. A tiny, 3.5 mm cystic nodule is noted in the midpole region. On the
left, there is a 2.1 cm, hypoechoic, solid nodule at the lower pole. In the
midpole region, there is a solid, mildly hypoechoic, 1.78 cm nodule that on
the initial views is mislabeled as right lobe. Corrected views are recorded
at the end of the exam. At the upper pole of the left lobe, there is a
mm, anechoic nodule compatible with a cystic nodule that measures 6.9 mm at
maximum diameter. No other nodules are identified. The thyroid echotexture
bilaterally is mildly heterogeneous. No thyroid calcifications are seen
within the nodules or within the thyroid tissue peripheral to the nodules.
IMPRESSION: Multinodular thyroid as noted above.

## 2012-08-15 IMAGING — US US SOFT TISSUE HEAD/NECK
1 series · 14 of 25 positions shown · non-contrast
Comparison: Thyroid ultrasound 05/08/2009

CLINICAL DATA: Follow-up multinodular goiter.

THYROID ULTRASOUND
TECHNIQUE: Ultrasound examination of the thyroid gland and adjacent
soft tissues was performed.

[Series 1: us soft tissue head/neck · 0.09mm/px · 14 of 55 slices shown]
[im 1/55]
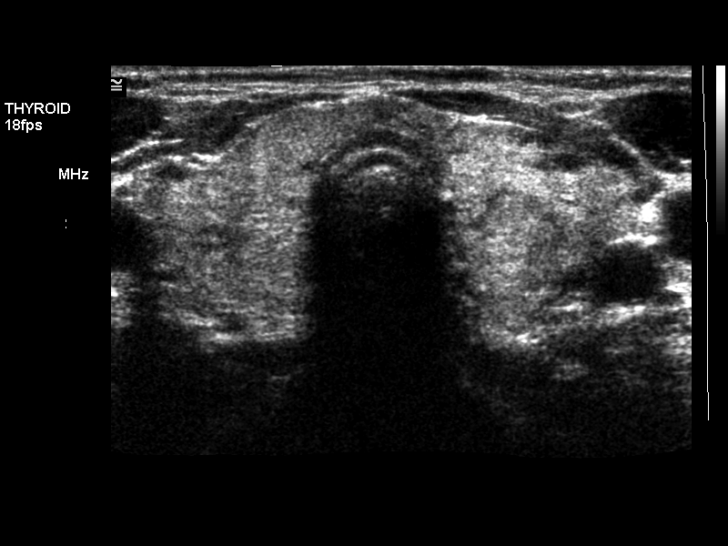
[im 5/55]
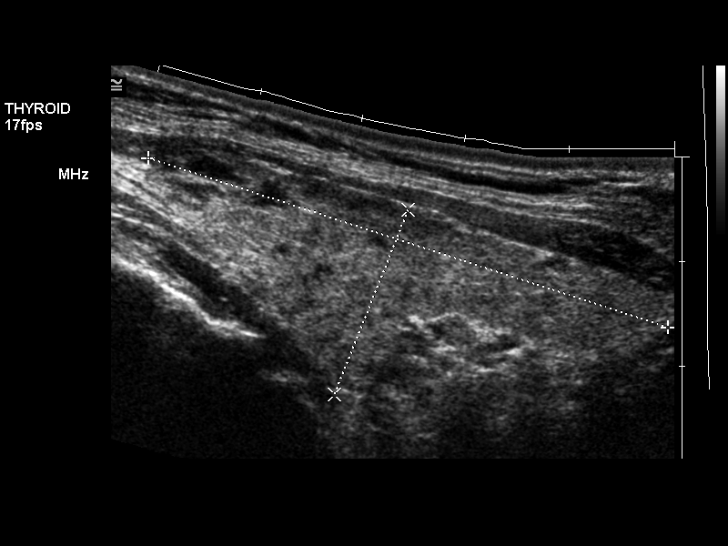
[im 10/55]
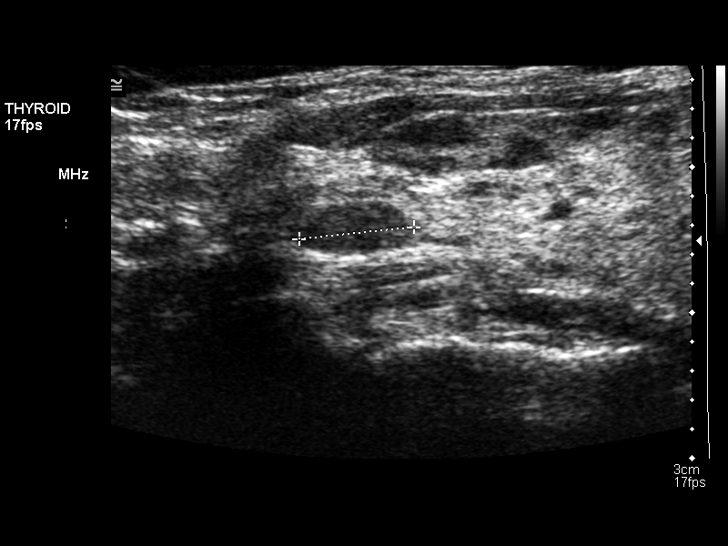
[im 14/55]
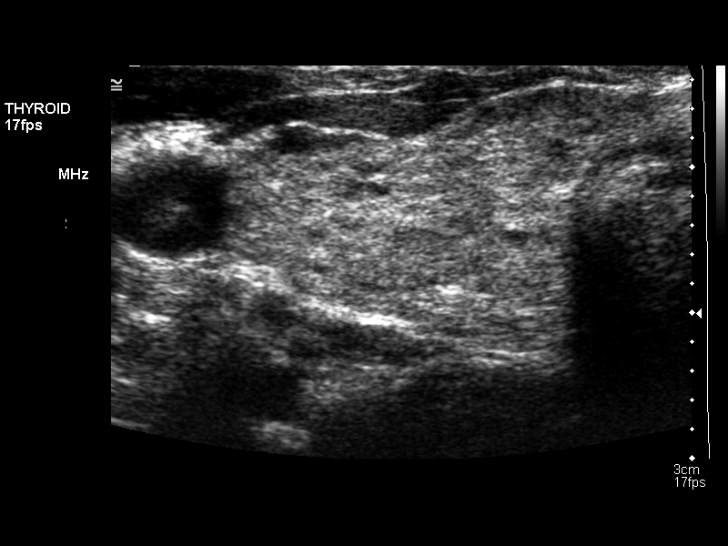
[im 19/55]
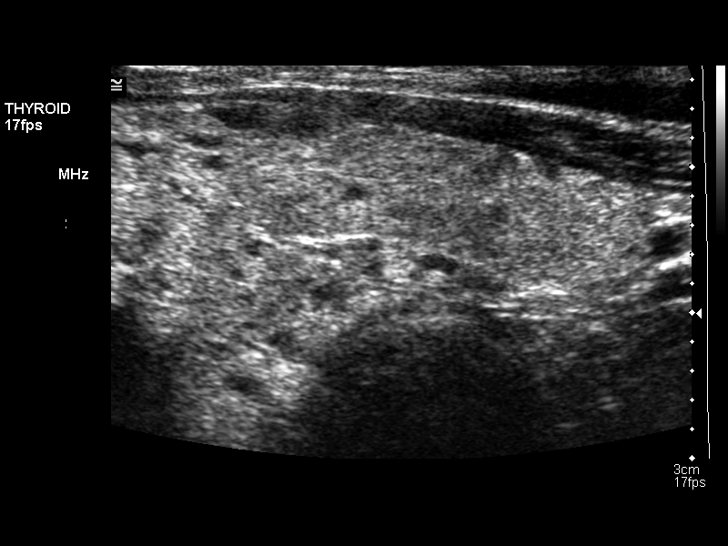
[im 21/55]
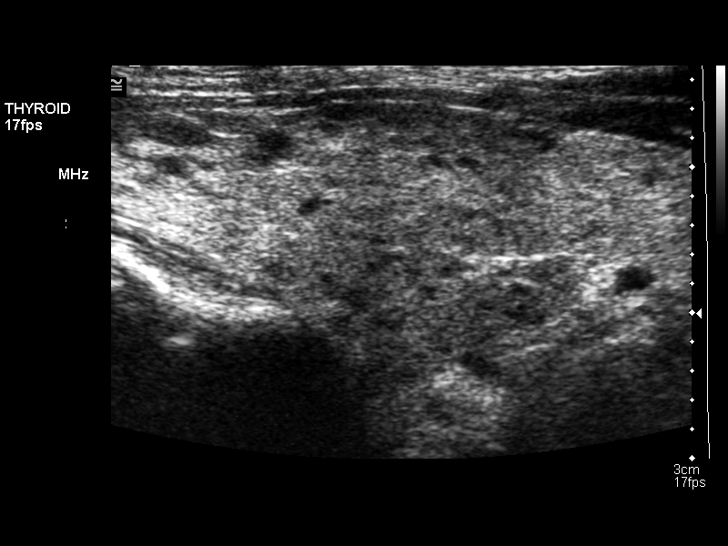
[im 25/55]
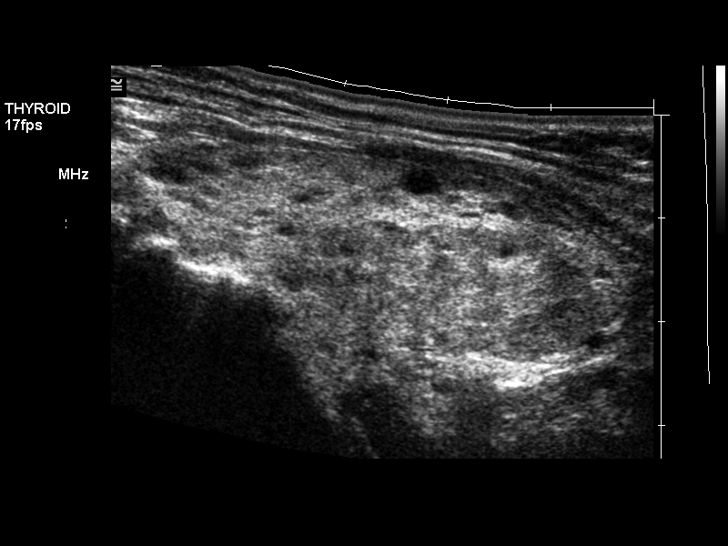
[im 30/55]
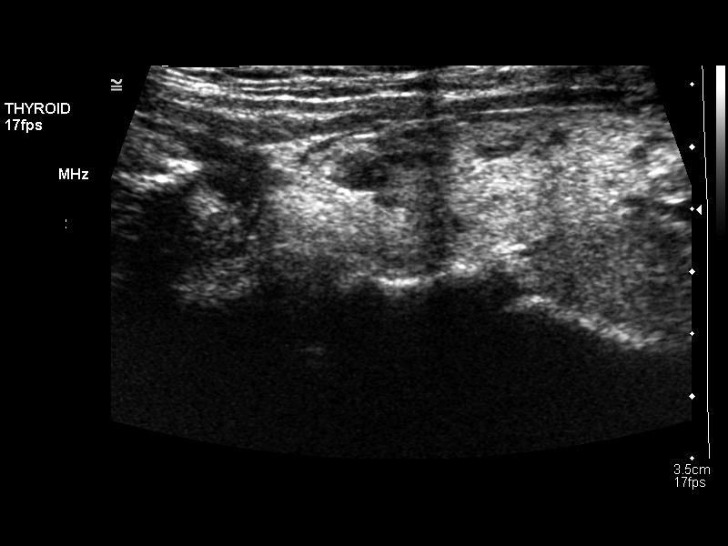
[im 34/55]
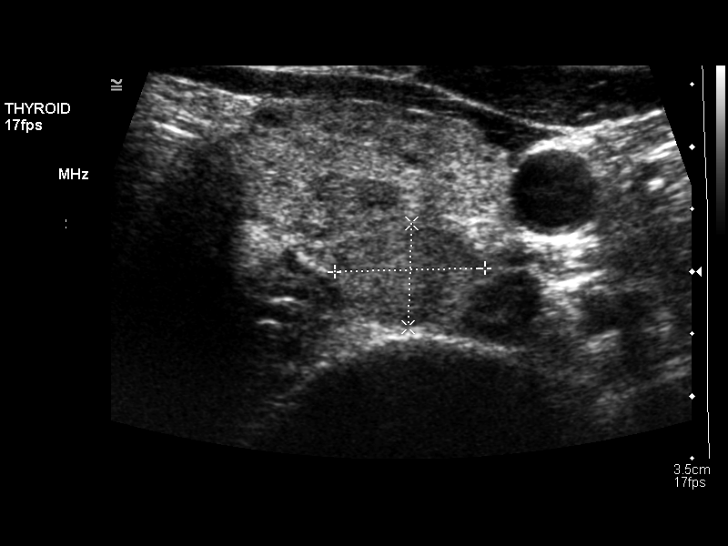
[im 37/55]
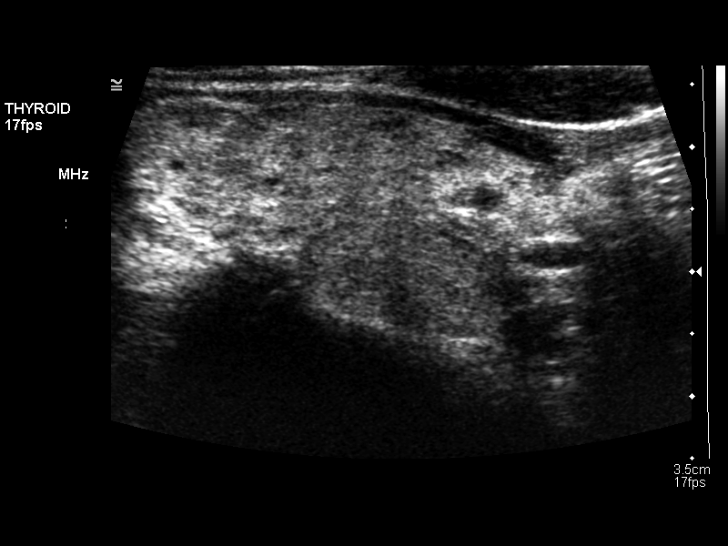
[im 41/55]
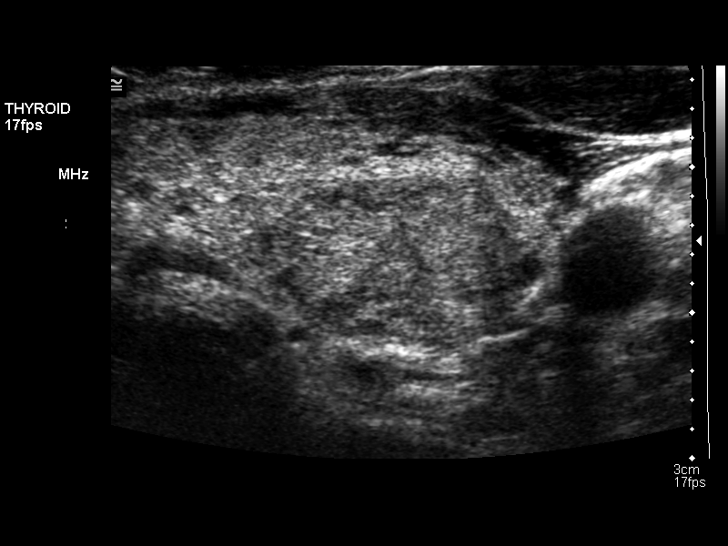
[im 46/55]
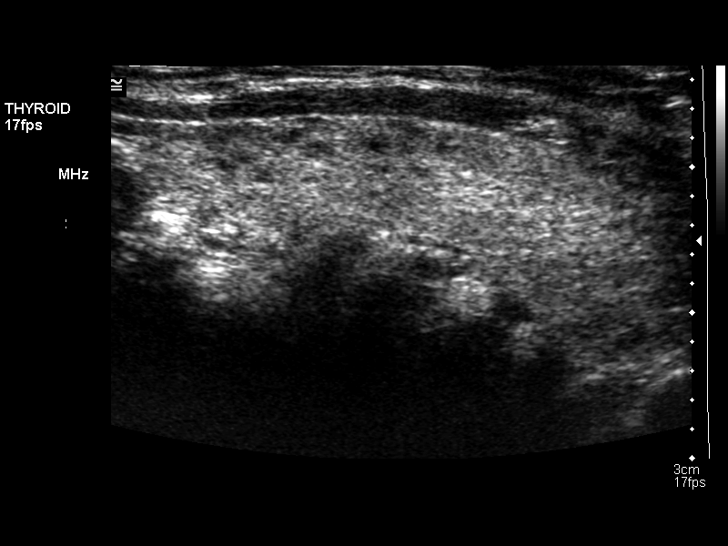
[im 50/55]
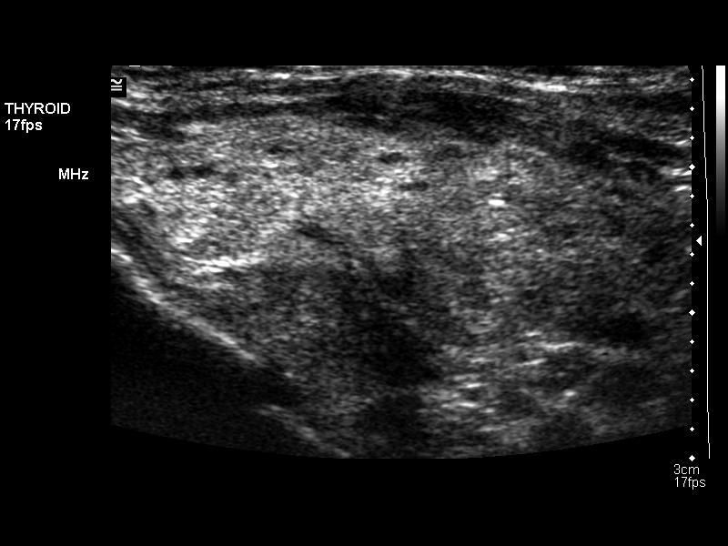
[im 55/55]
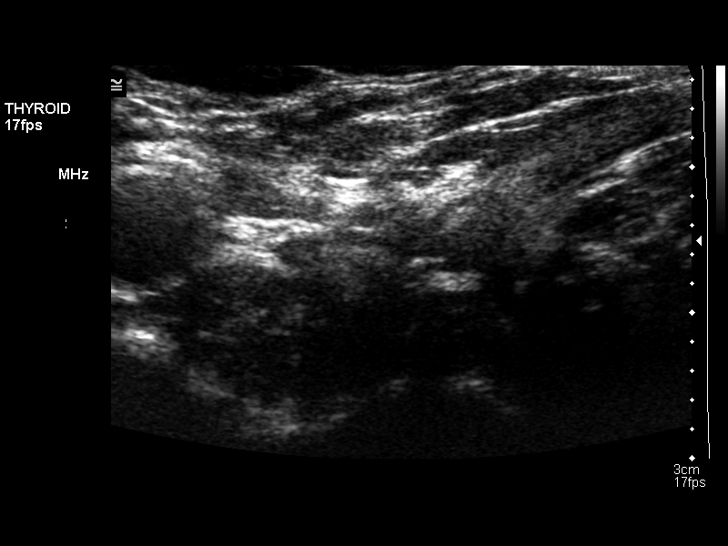

[14 of 25 positions shown; findings below may reference images not displayed]

FINDINGS: Right thyroid lobe:  Measures 5.2 x 1.9 x 2.0 cm.  Heterogeneous
echogenicity.
Left thyroid lobe:  Measures 5.3 x 1.9 x 2.4 cm.  Heterogeneous
echogenicity.
Isthmus:  0.5 cm.

Focal nodules:  There are multiple small stable thyroid nodules in
both lobes.  The largest nodules are in the inferior aspect of the
left lobe adjacent to each other.  The more superior nodule
measures 1.2 x 0.8 x 1.8 cm and the more inferior nodule measures
2.0 x 1.2 x 1.9 cm.  These are relatively stable.

Lymphadenopathy:  Absent
IMPRESSION: Stable multinodular goiter.  Recommend follow-up ultrasound
examination in 1 year.

## 2013-02-02 IMAGING — US US SOFT TISSUE HEAD/NECK
1 series · 14 of 25 positions shown · non-contrast
Comparison: 02/17/2010

CLINICAL DATA: Thyroid goiter.

THYROID ULTRASOUND
TECHNIQUE: Ultrasound examination of the thyroid gland and adjacent
soft tissues was performed.

[Series 1: us soft tissue head/neck · 0.06mm/px · 14 of 50 slices shown]
[im 1/50]
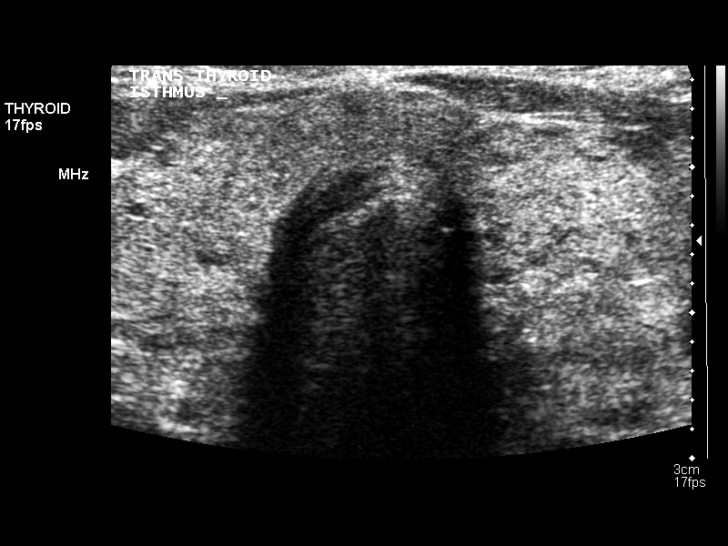
[im 5/50]
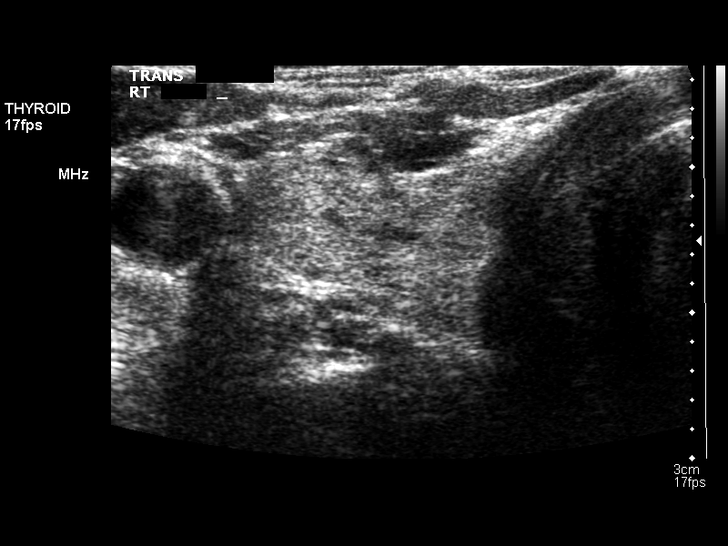
[im 9/50]
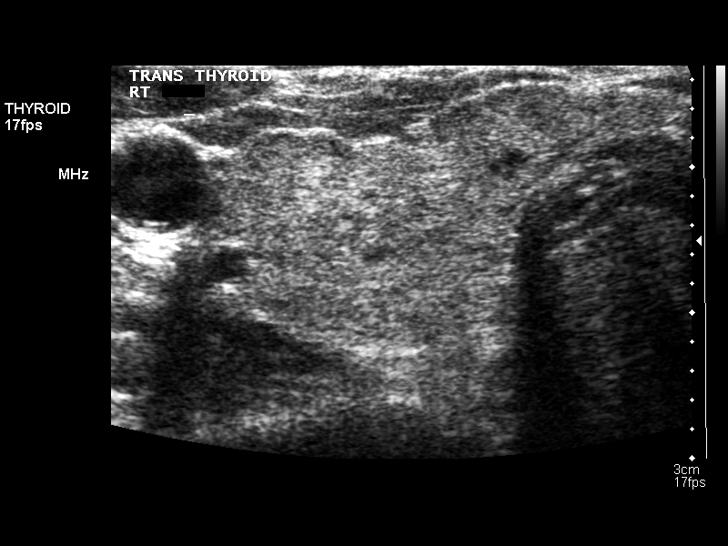
[im 13/50]
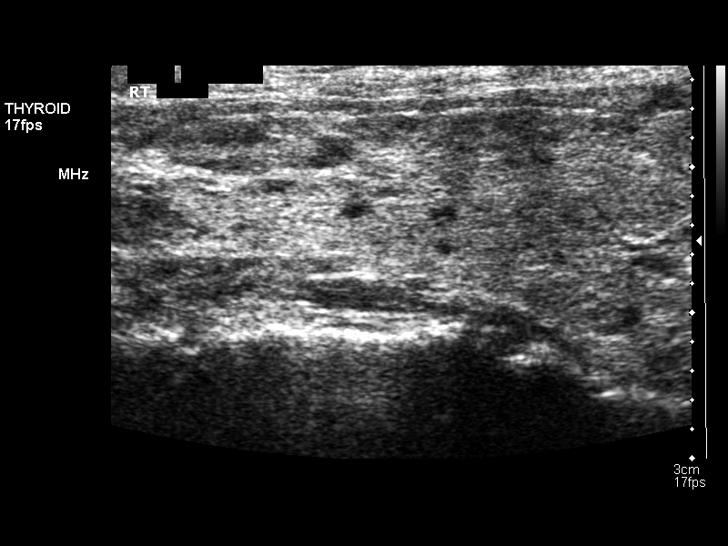
[im 17/50]
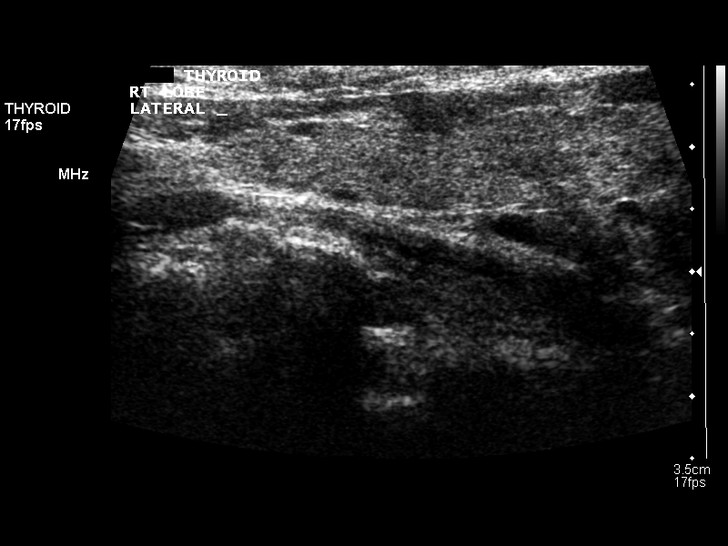
[im 19/50]
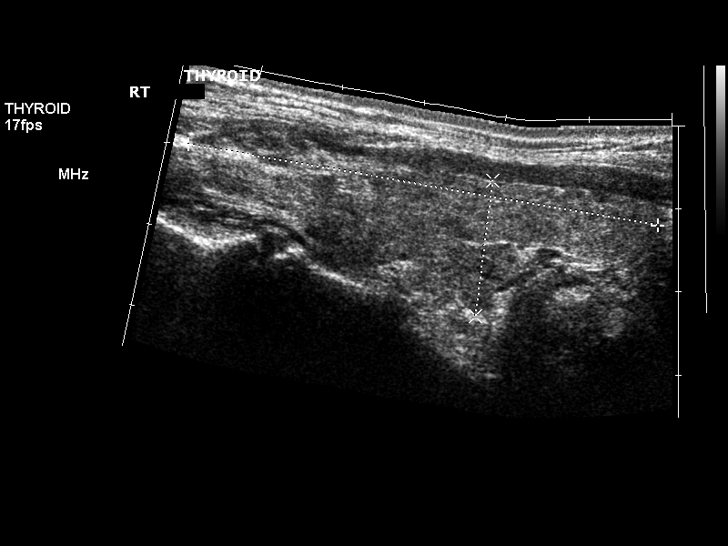
[im 23/50]
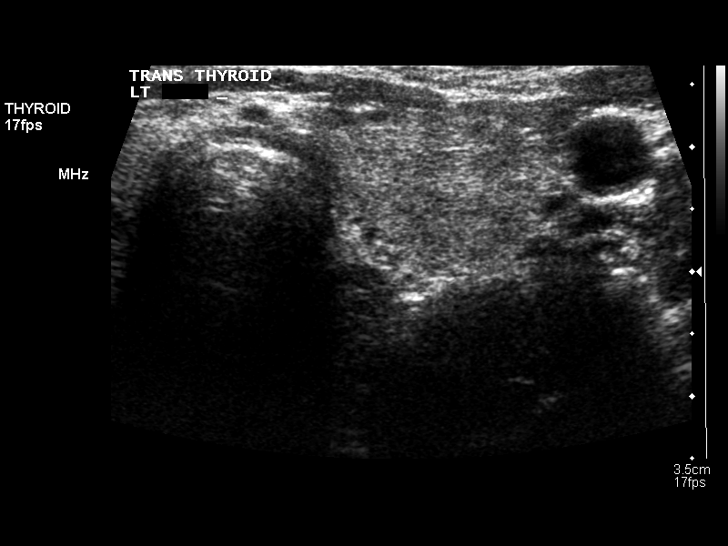
[im 27/50]
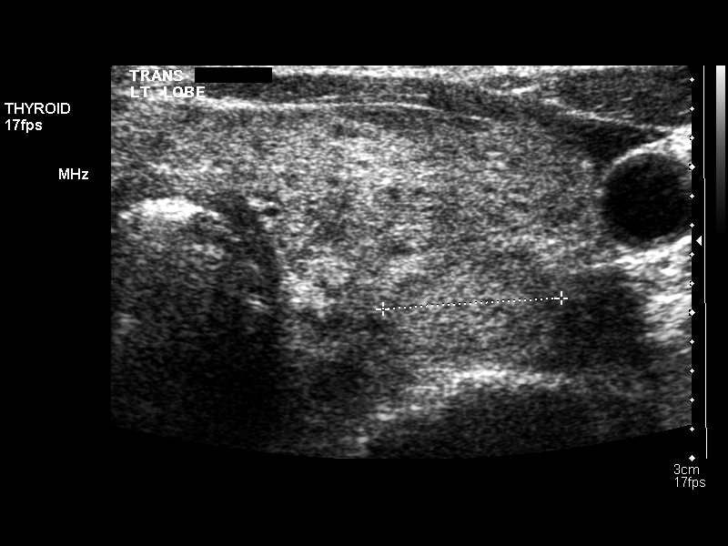
[im 31/50]
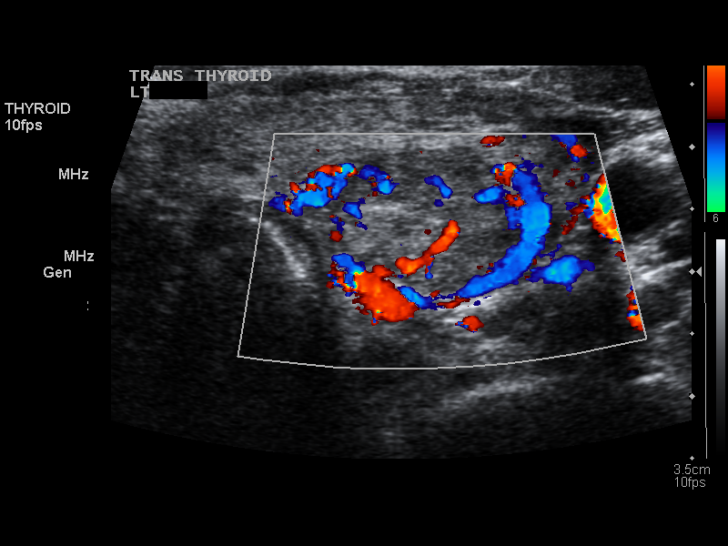
[im 33/50]
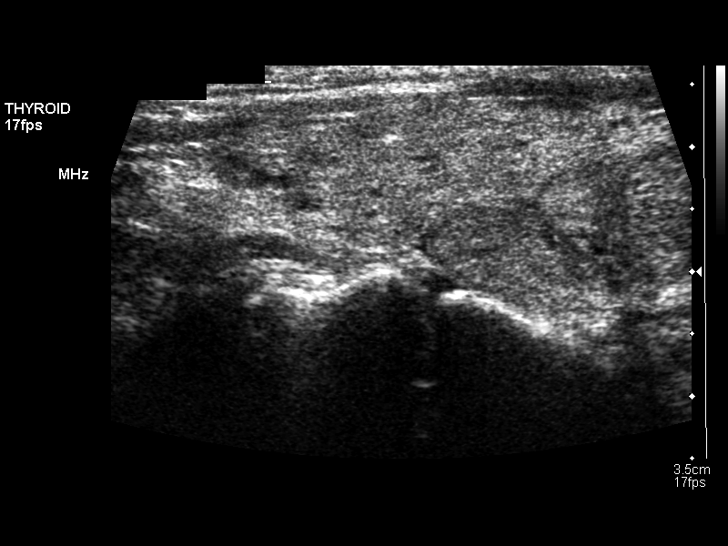
[im 37/50]
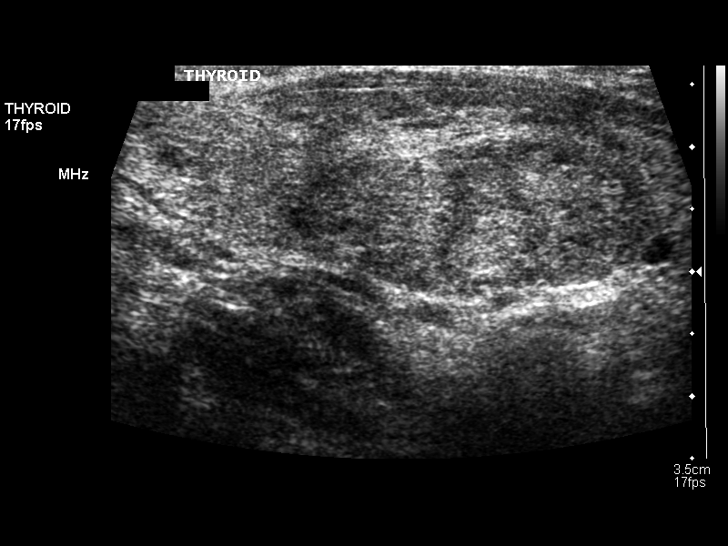
[im 41/50]
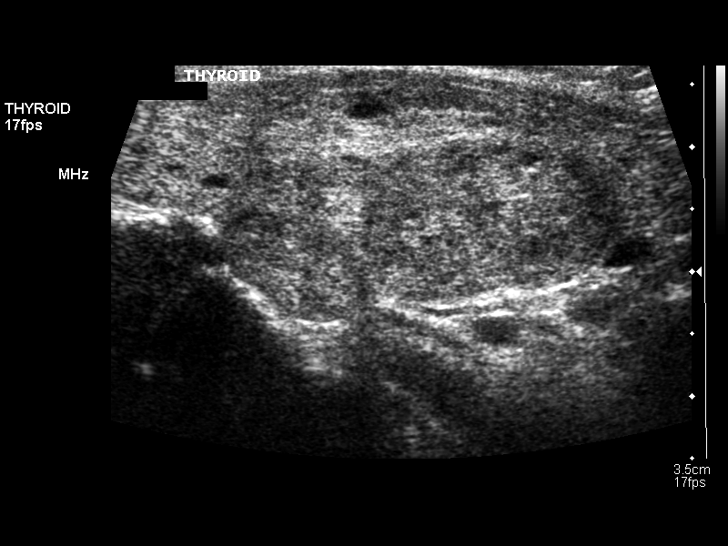
[im 45/50]
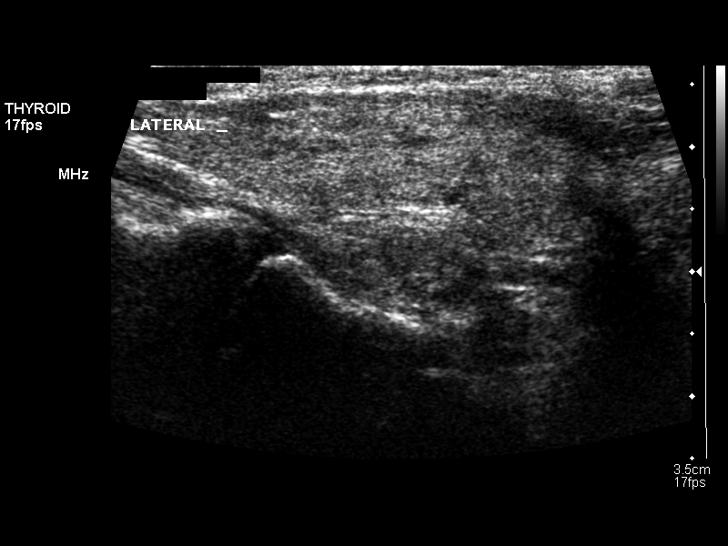
[im 50/50]
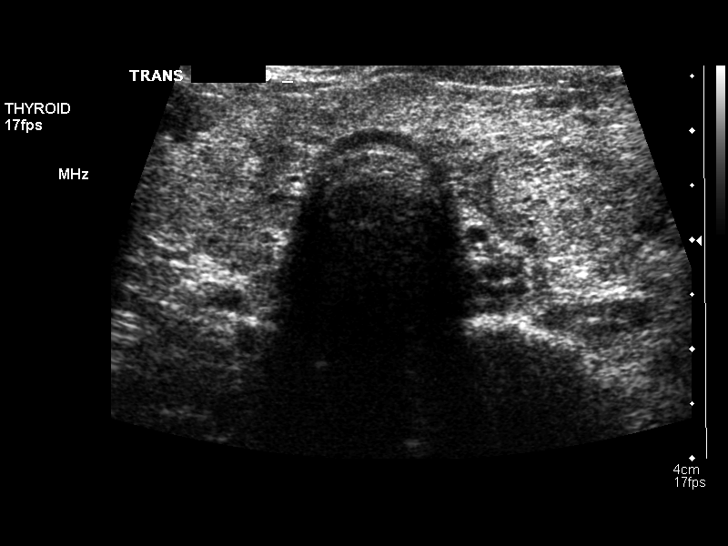

[14 of 25 positions shown; findings below may reference images not displayed]

FINDINGS: Right thyroid lobe:  5.7 x 1.6 x 2.2 cm
Left thyroid lobe:  5.6 x 1.9 x 2.1 cm.
Isthmus:  6 mm.

Focal nodules:  Heterogeneous thyroid echotexture again identified.
Numerous small nodules identified in both lobes.

The largest nodules are again 2 adjacent within the left lobe.  The
more cephalad measures 1.6 x 1.2 x 0.8 cm and is solid and
isoechoic.

More inferior measures 2.1 x 1.3 x 2.0 cm and is solid and
isoechoic.  These nodules appear similar to on the prior exam.

Lymphadenopathy:  None visualized.
IMPRESSION: Similar appearance of multinodular goiter.

## 2013-11-08 ENCOUNTER — Ambulatory Visit (INDEPENDENT_AMBULATORY_CARE_PROVIDER_SITE_OTHER): Payer: BC Managed Care – HMO | Admitting: Family Medicine

## 2013-11-08 ENCOUNTER — Encounter: Payer: Self-pay | Admitting: Family Medicine

## 2013-11-08 VITALS — BP 119/83 | HR 71 | Ht 66.0 in | Wt 158.0 lb

## 2013-11-08 DIAGNOSIS — Z124 Encounter for screening for malignant neoplasm of cervix: Secondary | ICD-10-CM

## 2013-11-08 DIAGNOSIS — N952 Postmenopausal atrophic vaginitis: Secondary | ICD-10-CM

## 2013-11-08 DIAGNOSIS — Z1151 Encounter for screening for human papillomavirus (HPV): Secondary | ICD-10-CM

## 2013-11-08 DIAGNOSIS — Z01419 Encounter for gynecological examination (general) (routine) without abnormal findings: Secondary | ICD-10-CM

## 2013-11-08 DIAGNOSIS — I252 Old myocardial infarction: Secondary | ICD-10-CM

## 2013-11-08 LAB — LIPID PANEL
CHOL/HDL RATIO: 1.9 ratio
CHOLESTEROL: 191 mg/dL (ref 0–200)
HDL: 101 mg/dL (ref >39)
LDL Cholesterol: 81 mg/dL (ref 0–99)
Triglycerides: 47 mg/dL (ref <150)
VLDL: 9 mg/dL (ref 0–40)

## 2013-11-08 LAB — COMPREHENSIVE METABOLIC PANEL
ALT: 11 U/L (ref 0–35)
AST: 15 U/L (ref 0–37)
Albumin: 4.2 g/dL (ref 3.5–5.2)
Alkaline Phosphatase: 77 U/L (ref 39–117)
BILIRUBIN TOTAL: 0.5 mg/dL (ref 0.2–1.2)
BUN: 18 mg/dL (ref 6–23)
CALCIUM: 9.6 mg/dL (ref 8.4–10.5)
CO2: 25 meq/L (ref 19–32)
CREATININE: 0.72 mg/dL (ref 0.50–1.10)
Chloride: 103 mEq/L (ref 96–112)
Glucose, Bld: 85 mg/dL (ref 70–99)
Potassium: 4.5 mEq/L (ref 3.5–5.3)
Sodium: 138 mEq/L (ref 135–145)
TOTAL PROTEIN: 6.9 g/dL (ref 6.0–8.3)

## 2013-11-08 LAB — CBC
HEMATOCRIT: 38.3 % (ref 36.0–46.0)
HEMOGLOBIN: 13.2 g/dL (ref 12.0–15.0)
MCH: 31.1 pg (ref 26.0–34.0)
MCHC: 34.5 g/dL (ref 30.0–36.0)
MCV: 90.1 fL (ref 78.0–100.0)
Platelets: 343 10*3/uL (ref 150–400)
RBC: 4.25 MIL/uL (ref 3.87–5.11)
RDW: 14 % (ref 11.5–15.5)
WBC: 5.8 10*3/uL (ref 4.0–10.5)

## 2013-11-08 LAB — TSH: TSH: 1.882 u[IU]/mL (ref 0.350–4.500)

## 2013-11-08 MED ORDER — ESTROGENS, CONJUGATED 0.625 MG/GM VA CREA: TOPICAL_CREAM | VAGINAL | Status: DC

## 2013-11-08 MED ORDER — ASPIRIN 81 MG PO CHEW: 81.0000 mg | CHEWABLE_TABLET | Freq: Every day | ORAL | Status: DC

## 2013-11-08 NOTE — Patient Instructions (Addendum)
Preventive Care for Adults, Female A healthy lifestyle and preventive care can promote health and wellness. Preventive health guidelines for women include the following key practices.  A routine yearly physical is a good way to check with your health care provider about your health and preventive screening. It is a chance to share any concerns and updates on your health and to receive a thorough exam.  Visit your dentist for a routine exam and preventive care every 6 months. Brush your teeth twice a day and floss once a day. Good oral hygiene prevents tooth decay and gum disease.  The frequency of eye exams is based on your age, health, family medical history, use of contact lenses, and other factors. Follow your health care provider's recommendations for frequency of eye exams.  Eat a healthy diet. Foods like vegetables, fruits, whole grains, low-fat dairy products, and lean protein foods contain the nutrients you need without too many calories. Decrease your intake of foods high in solid fats, added sugars, and salt. Eat the right amount of calories for you.Get information about a proper diet from your health care provider, if necessary.  Regular physical exercise is one of the most important things you can do for your health. Most adults should get at least 150 minutes of moderate-intensity exercise (any activity that increases your heart rate and causes you to sweat) each week. In addition, most adults need muscle-strengthening exercises on 2 or more days a week.  Maintain a healthy weight. The body mass index (BMI) is a screening tool to identify possible weight problems. It provides an estimate of body fat based on height and weight. Your health care provider can find your BMI, and can help you achieve or maintain a healthy weight.For adults 20 years and older:  A BMI below 18.5 is considered underweight.  A BMI of 18.5 to 24.9 is normal.  A BMI of 25 to 29.9 is considered overweight.  A  BMI of 30 and above is considered obese.  Maintain normal blood lipids and cholesterol levels by exercising and minimizing your intake of saturated fat. Eat a balanced diet with plenty of fruit and vegetables. Blood tests for lipids and cholesterol should begin at age 62 and be repeated every 5 years. If your lipid or cholesterol levels are high, you are over 50, or you are at high risk for heart disease, you may need your cholesterol levels checked more frequently.Ongoing high lipid and cholesterol levels should be treated with medicines if diet and exercise are not working.  If you smoke, find out from your health care provider how to quit. If you do not use tobacco, do not start.  Lung cancer screening is recommended for adults aged 36 80 years who are at high risk for developing lung cancer because of a history of smoking. A yearly low-dose CT scan of the lungs is recommended for people who have at least a 55-pack-year history of smoking and are a current smoker or have quit within the past 15 years. A pack year of smoking is smoking an average of 1 pack of cigarettes a day for 1 year (for example: 1 pack a day for 30 years or 2 packs a day for 15 years). Yearly screening should continue until the smoker has stopped smoking for at least 15 years. Yearly screening should be stopped for people who develop a health problem that would prevent them from having lung cancer treatment.  If you are pregnant, do not drink alcohol. If you  are breastfeeding, be very cautious about drinking alcohol. If you are not pregnant and choose to drink alcohol, do not have more than 1 drink per day. One drink is considered to be 12 ounces (355 mL) of beer, 5 ounces (148 mL) of wine, or 1.5 ounces (44 mL) of liquor.  Avoid use of street drugs. Do not share needles with anyone. Ask for help if you need support or instructions about stopping the use of drugs.  High blood pressure causes heart disease and increases the risk  of stroke. Your blood pressure should be checked at least every 55 to 2 years. Ongoing high blood pressure should be treated with medicines if weight loss and exercise do not work.  If you are 55 55 years old, ask your health care provider if you should take aspirin to prevent strokes.  Diabetes screening involves taking a blood sample to check your fasting blood sugar level. This should be done once every 3 years, after age 55, if you are within normal weight and without risk factors for diabetes. Testing should be considered at a younger age or be carried out more frequently if you are overweight and have at least 1 risk factor for diabetes.  Breast cancer screening is essential preventive care for women. You should practice "breast self-awareness." This means understanding the normal appearance and feel of your breasts and may include breast self-examination. Any changes detected, no matter how small, should be reported to a health care provider. Women in their 40s and 30s should have a clinical breast exam (CBE) by a health care provider as part of a regular health exam every 1 to 3 years. After age 28, women should have a CBE every year. Starting at age 55, women should consider having a mammogram (breast X-ray test) every year. Women who have a family history of breast cancer should talk to their health care provider about genetic screening. Women at a high risk of breast cancer should talk to their health care providers about having an MRI and a mammogram every year.  Breast cancer gene (BRCA)-related cancer risk assessment is recommended for women who have family members with BRCA-related cancers. BRCA-related cancers include breast, ovarian, tubal, and peritoneal cancers. Having family members with these cancers may be associated with an increased risk for harmful changes (mutations) in the breast cancer genes BRCA1 and BRCA2. Results of the assessment will determine the need for genetic counseling  and BRCA1 and BRCA2 testing.  The Pap test is a screening test for cervical cancer. A Pap test can show cell changes on the cervix that might become cervical cancer if left untreated. A Pap test is a procedure in which cells are obtained and examined from the lower end of the uterus (cervix).  Women should have a Pap test starting at age 59.  Between ages 42 and 13, Pap tests should be repeated every 2 years.  Beginning at age 53, you should have a Pap test every 3 years as long as the past 3 Pap tests have been normal.  Some women have medical problems that increase the chance of getting cervical cancer. Talk to your health care provider about these problems. It is especially important to talk to your health care provider if a new problem develops soon after your last Pap test. In these cases, your health care provider may recommend more frequent screening and Pap tests.  The above recommendations are the same for women who have or have not gotten the vaccine  for human papillomavirus (HPV).  If you had a hysterectomy for a problem that was not cancer or a condition that could lead to cancer, then you no longer need Pap tests. Even if you no longer need a Pap test, a regular exam is a good idea to make sure no other problems are starting.  If you are between ages 58 and 10 years, and you have had normal Pap tests going back 10 years, you no longer need Pap tests. Even if you no longer need a Pap test, a regular exam is a good idea to make sure no other problems are starting.  If you have had past treatment for cervical cancer or a condition that could lead to cancer, you need Pap tests and screening for cancer for at least 20 years after your treatment.  If Pap tests have been discontinued, risk factors (such as a new sexual partner) need to be reassessed to determine if screening should be resumed.  The HPV test is an additional test that may be used for cervical cancer screening. The HPV test  looks for the virus that can cause the cell changes on the cervix. The cells collected during the Pap test can be tested for HPV. The HPV test could be used to screen women aged 67 years and older, and should be used in women of any age who have unclear Pap test results. After the age of 65, women should have HPV testing at the same frequency as a Pap test.  Colorectal cancer can be detected and often prevented. Most routine colorectal cancer screening begins at the age of 25 years and continues through age 66 years. However, your health care provider may recommend screening at an earlier age if you have risk factors for colon cancer. On a yearly basis, your health care provider may provide home test kits to check for hidden blood in the stool. Use of a small camera at the end of a tube, to directly examine the colon (sigmoidoscopy or colonoscopy), can detect the earliest forms of colorectal cancer. Talk to your health care provider about this at age 79, when routine screening begins. Direct exam of the colon should be repeated every 5 10 years through age 47 years, unless early forms of pre-cancerous polyps or small growths are found.  People who are at an increased risk for hepatitis B should be screened for this virus. You are considered at high risk for hepatitis B if:  You were born in a country where hepatitis B occurs often. Talk with your health care provider about which countries are considered high risk.  Your parents were born in a high-risk country and you have not received a shot to protect against hepatitis B (hepatitis B vaccine).  You have HIV or AIDS.  You use needles to inject street drugs.  You live with, or have sex with, someone who has Hepatitis B.  You get hemodialysis treatment.  You take certain medicines for conditions like cancer, organ transplantation, and autoimmune conditions.  Hepatitis C blood testing is recommended for all people born from [birth year] through 1965 and  any individual with known risks for hepatitis C.  Practice safe sex. Use condoms and avoid high-risk sexual practices to reduce the spread of sexually transmitted infections (STIs). STIs include gonorrhea, chlamydia, syphilis, trichomonas, herpes, HPV, and human immunodeficiency virus (HIV). Herpes, HIV, and HPV are viral illnesses that have no cure. They can result in disability, cancer, and death. Sexually active women aged 66  years and younger should be checked for chlamydia. Older women with new or multiple partners should also be tested for chlamydia. Testing for other STIs is recommended if you are sexually active and at increased risk.  Osteoporosis is a disease in which the bones lose minerals and strength with aging. This can result in serious bone fractures or breaks. The risk of osteoporosis can be identified using a bone density scan. Women ages 18 years and over and women at risk for fractures or osteoporosis should discuss screening with their health care providers. Ask your health care provider whether you should take a calcium supplement or vitamin D to reduce the rate of osteoporosis.  Menopause can be associated with physical symptoms and risks. Hormone replacement therapy is available to decrease symptoms and risks. You should talk to your health care provider about whether hormone replacement therapy is right for you.  Use sunscreen. Apply sunscreen liberally and repeatedly throughout the day. You should seek shade when your shadow is shorter than you. Protect yourself by wearing long sleeves, pants, a wide-brimmed hat, and sunglasses year round, whenever you are outdoors.  Once a month, do a whole body skin exam, using a mirror to look at the skin on your back. Tell your health care provider of new moles, moles that have irregular borders, moles that are larger than a pencil eraser, or moles that have changed in shape or color.  Stay current with required vaccines  (immunizations).  Influenza vaccine. All adults should be immunized every year.  Tetanus, diphtheria, and acellular pertussis (Td, Tdap) vaccine. Pregnant women should receive 1 dose of Tdap vaccine during each pregnancy. The dose should be obtained regardless of the length of time since the last dose. Immunization is preferred during the 27th 36th week of gestation. An adult who has not previously received Tdap or who does not know her vaccine status should receive 1 dose of Tdap. This initial dose should be followed by tetanus and diphtheria toxoids (Td) booster doses every 10 years. Adults with an unknown or incomplete history of completing a 3-dose immunization series with Td-containing vaccines should begin or complete a primary immunization series including a Tdap dose. Adults should receive a Td booster every 10 years.  Varicella vaccine. An adult without evidence of immunity to varicella should receive 2 doses or a second dose if she has previously received 1 dose. Pregnant females who do not have evidence of immunity should receive the first dose after pregnancy. This first dose should be obtained before leaving the health care facility. The second dose should be obtained 4 8 weeks after the first dose.  Human papillomavirus (HPV) vaccine. Females aged 9 26 years who have not received the vaccine previously should obtain the 3-dose series. The vaccine is not recommended for use in pregnant females. However, pregnancy testing is not needed before receiving a dose. If a female is found to be pregnant after receiving a dose, no treatment is needed. In that case, the remaining doses should be delayed until after the pregnancy. Immunization is recommended for any person with an immunocompromised condition through the age of 51 years if she did not get any or all doses earlier. During the 3-dose series, the second dose should be obtained 4 8 weeks after the first dose. The third dose should be obtained  24 weeks after the first dose and 16 weeks after the second dose.  Zoster vaccine. One dose is recommended for adults aged 57 years or older unless certain  conditions are present.  Measles, mumps, and rubella (MMR) vaccine. Adults born before [birth year] generally are considered immune to measles and mumps. Adults born in [birth year] or later should have 1 or more doses of MMR vaccine unless there is a contraindication to the vaccine or there is laboratory evidence of immunity to each of the three diseases. A routine second dose of MMR vaccine should be obtained at least 28 days after the first dose for students attending postsecondary schools, health care workers, or international travelers. People who received inactivated measles vaccine or an unknown type of measles vaccine during 1963 1967 should receive 2 doses of MMR vaccine. People who received inactivated mumps vaccine or an unknown type of mumps vaccine before 1979 and are at high risk for mumps infection should consider immunization with 2 doses of MMR vaccine. For females of childbearing age, rubella immunity should be determined. If there is no evidence of immunity, females who are not pregnant should be vaccinated. If there is no evidence of immunity, females who are pregnant should delay immunization until after pregnancy. Unvaccinated health care workers born before [birth year] who lack laboratory evidence of measles, mumps, or rubella immunity or laboratory confirmation of disease should consider measles and mumps immunization with 2 doses of MMR vaccine or rubella immunization with 1 dose of MMR vaccine.  Pneumococcal 13-valent conjugate (PCV13) vaccine. When indicated, a person who is uncertain of her immunization history and has no record of immunization should receive the PCV13 vaccine. An adult aged 42 years or older who has certain medical conditions and has not been previously immunized should receive 1 dose of PCV13 vaccine. This PCV13 should be followed  with a dose of pneumococcal polysaccharide (PPSV23) vaccine. The PPSV23 vaccine dose should be obtained at least 8 weeks after the dose of PCV13 vaccine. An adult aged 4 years or older who has certain medical conditions and previously received 1 or more doses of PPSV23 vaccine should receive 1 dose of PCV13. The PCV13 vaccine dose should be obtained 1 or more years after the last PPSV23 vaccine dose.  Pneumococcal polysaccharide (PPSV23) vaccine. When PCV13 is also indicated, PCV13 should be obtained first. All adults aged 27 years and older should be immunized. An adult younger than age 33 years who has certain medical conditions should be immunized. Any person who resides in a nursing home or long-term care facility should be immunized. An adult smoker should be immunized. People with an immunocompromised condition and certain other conditions should receive both PCV13 and PPSV23 vaccines. People with human immunodeficiency virus (HIV) infection should be immunized as soon as possible after diagnosis. Immunization during chemotherapy or radiation therapy should be avoided. Routine use of PPSV23 vaccine is not recommended for American Indians, [place] Natives, or people younger than 65 years unless there are medical conditions that require PPSV23 vaccine. When indicated, people who have unknown immunization and have no record of immunization should receive PPSV23 vaccine. One-time revaccination 5 years after the first dose of PPSV23 is recommended for people aged 13 64 years who have chronic kidney failure, nephrotic syndrome, asplenia, or immunocompromised conditions. People who received 1 2 doses of PPSV23 before age 66 years should receive another dose of PPSV23 vaccine at age 27 years or later if at least 5 years have passed since the previous dose. Doses of PPSV23 are not needed for people immunized with PPSV23 at or after age 33 years.  Meningococcal vaccine. Adults with asplenia or persistent complement  component deficiencies should receive 2  doses of quadrivalent meningococcal conjugate (MenACWY-D) vaccine. The doses should be obtained at least 2 months apart. Microbiologists working with certain meningococcal bacteria, [place] recruits, people at risk during an outbreak, and people who travel to or live in countries with a high rate of meningitis should be immunized. A first-year college student up through age 49 years who is living in a residence hall should receive a dose if she did not receive a dose on or after her 16th birthday. Adults who have certain high-risk conditions should receive one or more doses of vaccine.  Hepatitis A vaccine. Adults who wish to be protected from this disease, have certain high-risk conditions, work with hepatitis A-infected animals, work in hepatitis A research labs, or travel to or work in countries with a high rate of hepatitis A should be immunized. Adults who were previously unvaccinated and who anticipate close contact with an international adoptee during the first 60 days after arrival in the [place] from a country with a high rate of hepatitis A should be immunized.  Hepatitis B vaccine. Adults who wish to be protected from this disease, have certain high-risk conditions, may be exposed to blood or other infectious body fluids, are household contacts or sex partners of hepatitis B positive people, are clients or workers in certain care facilities, or travel to or work in countries with a high rate of hepatitis B should be immunized.  Haemophilus influenzae type b (Hib) vaccine. A previously unvaccinated person with asplenia or sickle cell disease or having a scheduled splenectomy should receive 1 dose of Hib vaccine. Regardless of previous immunization, a recipient of a hematopoietic stem cell transplant should receive a 3-dose series 6 12 months after her successful transplant. Hib vaccine is not recommended for adults with HIV infection. Preventive  Services / Frequency Ages 24 to 39years  Blood pressure check.** / Every 1 to 2 years.  Lipid and cholesterol check.** / Every 5 years beginning at age 66.  Clinical breast exam.** / Every 3 years for women in their 12s and 24s.  BRCA-related cancer risk assessment.** / For women who have family members with a BRCA-related cancer (breast, ovarian, tubal, or peritoneal cancers).  Pap test.** / Every 2 years from ages 31 through 69. Every 3 years starting at age 64 through age 76 or 89 with a history of 3 consecutive normal Pap tests.  HPV screening.** / Every 3 years from ages 10 through ages 10 to 96 with a history of 3 consecutive normal Pap tests.  Hepatitis C blood test.** / For any individual with known risks for hepatitis C.  Skin self-exam. / Monthly.  Influenza vaccine. / Every year.  Tetanus, diphtheria, and acellular pertussis (Tdap, Td) vaccine.** / Consult your health care provider. Pregnant women should receive 1 dose of Tdap vaccine during each pregnancy. 1 dose of Td every 10 years.  Varicella vaccine.** / Consult your health care provider. Pregnant females who do not have evidence of immunity should receive the first dose after pregnancy.  HPV vaccine. / 3 doses over 6 months, if 90 and younger. The vaccine is not recommended for use in pregnant females. However, pregnancy testing is not needed before receiving a dose.  Measles, mumps, rubella (MMR) vaccine.** / You need at least 1 dose of MMR if you were born in 1957 or later. You may also need a 2nd dose. For females of childbearing age, rubella immunity should be determined. If there is no evidence of immunity, females who are not  pregnant should be vaccinated. If there is no evidence of immunity, females who are pregnant should delay immunization until after pregnancy.  Pneumococcal 13-valent conjugate (PCV13) vaccine.** / Consult your health care provider.  Pneumococcal polysaccharide (PPSV23) vaccine.** / 1 to 2  doses if you smoke cigarettes or if you have certain conditions.  Meningococcal vaccine.** / 1 dose if you are age 88 to 6 years and a Market researcher living in a residence hall, or have one of several medical conditions, you need to get vaccinated against meningococcal disease. You may also need additional booster doses.  Hepatitis A vaccine.** / Consult your health care provider.  Hepatitis B vaccine.** / Consult your health care provider.  Haemophilus influenzae type b (Hib) vaccine.** / Consult your health care provider. Ages 23 to 64years  Blood pressure check.** / Every 1 to 2 years.  Lipid and cholesterol check.** / Every 5 years beginning at age 20 years.  Lung cancer screening. / Every year if you are aged 51 80 years and have a 30-pack-year history of smoking and currently smoke or have quit within the past 15 years. Yearly screening is stopped once you have quit smoking for at least 15 years or develop a health problem that would prevent you from having lung cancer treatment.  Clinical breast exam.** / Every year after age 8 years.  BRCA-related cancer risk assessment.** / For women who have family members with a BRCA-related cancer (breast, ovarian, tubal, or peritoneal cancers).  Mammogram.** / Every year beginning at age 10 years and continuing for as long as you are in good health. Consult with your health care provider.  Pap test.** / Every 3 years starting at age 30 years through age 5 or 61 years with a history of 3 consecutive normal Pap tests.  HPV screening.** / Every 3 years from ages 39 years through ages 72 to 19 years with a history of 3 consecutive normal Pap tests.  Fecal occult blood test (FOBT) of stool. / Every year beginning at age 59 years and continuing until age 27 years. You may not need to do this test if you get a colonoscopy every 10 years.  Flexible sigmoidoscopy or colonoscopy.** / Every 5 years for a flexible sigmoidoscopy or every  10 years for a colonoscopy beginning at age 110 years and continuing until age 63 years.  Hepatitis C blood test.** / For all people born from [birth year] through 1967 and any individual with known risks for hepatitis C.  Skin self-exam. / Monthly.  Influenza vaccine. / Every year.  Tetanus, diphtheria, and acellular pertussis (Tdap/Td) vaccine.** / Consult your health care provider. Pregnant women should receive 1 dose of Tdap vaccine during each pregnancy. 1 dose of Td every 10 years.  Varicella vaccine.** / Consult your health care provider. Pregnant females who do not have evidence of immunity should receive the first dose after pregnancy.  Zoster vaccine.** / 1 dose for adults aged 46 years or older.  Measles, mumps, rubella (MMR) vaccine.** / You need at least 1 dose of MMR if you were born in 1957 or later. You may also need a 2nd dose. For females of childbearing age, rubella immunity should be determined. If there is no evidence of immunity, females who are not pregnant should be vaccinated. If there is no evidence of immunity, females who are pregnant should delay immunization until after pregnancy.  Pneumococcal 13-valent conjugate (PCV13) vaccine.** / Consult your health care provider.  Pneumococcal polysaccharide (PPSV23) vaccine.** / 1  to 2 doses if you smoke cigarettes or if you have certain conditions.  Meningococcal vaccine.** / Consult your health care provider.  Hepatitis A vaccine.** / Consult your health care provider.  Hepatitis B vaccine.** / Consult your health care provider.  Haemophilus influenzae type b (Hib) vaccine.** / Consult your health care provider. Ages 65 years and over  Blood pressure check.** / Every 1 to 2 years.  Lipid and cholesterol check.** / Every 5 years beginning at age 59 years.  Lung cancer screening. / Every year if you are aged 43 80 years and have a 30-pack-year history of smoking and currently smoke or have quit within the past 15 years.  Yearly screening is stopped once you have quit smoking for at least 15 years or develop a health problem that would prevent you from having lung cancer treatment.  Clinical breast exam.** / Every year after age 71 years.  BRCA-related cancer risk assessment.** / For women who have family members with a BRCA-related cancer (breast, ovarian, tubal, or peritoneal cancers).  Mammogram.** / Every year beginning at age 34 years and continuing for as long as you are in good health. Consult with your health care provider.  Pap test.** / Every 3 years starting at age 52 years through age 51 or 105 years with 3 consecutive normal Pap tests. Testing can be stopped between 65 and 70 years with 3 consecutive normal Pap tests and no abnormal Pap or HPV tests in the past 10 years.  HPV screening.** / Every 3 years from ages 52 years through ages 24 or 78 years with a history of 3 consecutive normal Pap tests. Testing can be stopped between 65 and 70 years with 3 consecutive normal Pap tests and no abnormal Pap or HPV tests in the past 10 years.  Fecal occult blood test (FOBT) of stool. / Every year beginning at age 67 years and continuing until age 55 years. You may not need to do this test if you get a colonoscopy every 10 years.  Flexible sigmoidoscopy or colonoscopy.** / Every 5 years for a flexible sigmoidoscopy or every 10 years for a colonoscopy beginning at age 47 years and continuing until age 16 years.  Hepatitis C blood test.** / For all people born from [birth year] through 1967 and any individual with known risks for hepatitis C.  Osteoporosis screening.** / A one-time screening for women ages 70 years and over and women at risk for fractures or osteoporosis.  Skin self-exam. / Monthly.  Influenza vaccine. / Every year.  Tetanus, diphtheria, and acellular pertussis (Tdap/Td) vaccine.** / 1 dose of Td every 10 years.  Varicella vaccine.** / Consult your health care provider.  Zoster vaccine.** / 1  dose for adults aged 17 years or older.  Pneumococcal 13-valent conjugate (PCV13) vaccine.** / Consult your health care provider.  Pneumococcal polysaccharide (PPSV23) vaccine.** / 1 dose for all adults aged 35 years and older.  Meningococcal vaccine.** / Consult your health care provider.  Hepatitis A vaccine.** / Consult your health care provider.  Hepatitis B vaccine.** / Consult your health care provider.  Haemophilus influenzae type b (Hib) vaccine.** / Consult your health care provider. ** Family history and personal history of risk and conditions may change your health care provider's recommendations. Document Released: 08/11/2001 Document Revised: 04/05/2013 Document Reviewed: 11/10/2010 [facility] Patient Information 2014 [place], [place]. Atrophic Vaginitis Atrophic vaginitis is a problem of low levels of estrogen in women. This problem can happen at any age. It is most common  in women who have gone through menopause ("the change").  HOW WILL I KNOW IF I HAVE THIS PROBLEM? You may have:  Trouble with peeing (urinating), such as:  Going to the bathroom often.  A hard time holding your pee until you reach a bathroom.  Leaking pee.  Having pain when you pee.  Itching or a burning feeling.  Vaginal bleeding and spotting.  Pain during sex.  Dryness of the vagina.  A yellow, bad-smelling fluid (discharge) coming from the vagina. HOW WILL MY DOCTOR CHECK FOR THIS PROBLEM?  During your exam, your doctor will likely find the problem.  If there is a vaginal fluid, it may be checked for infection. HOW WILL THIS PROBLEM BE TREATED? Keep the vulvar skin as clean as possible. Moisturizers and lubricants can help with some of the symptoms. Estrogen replacement can help. There are 2 ways to take estrogen:  Systemic estrogen gets estrogen to your whole body. It takes many weeks or months before the symptoms get better.  You take an estrogen pill.  You use a skin patch. This  is a patch that you put on your skin.  If you still have your uterus, your doctor may ask you to take a hormone. Talk to your doctor about the right medicine for you.  Estrogen cream.  This puts estrogen only at the part of your body where you apply it. The cream is put into the vagina or put on the vulvar skin. For some women, estrogen cream works faster than pills or the patch. CAN ALL WOMEN WITH THIS PROBLEM USE ESTROGEN? No. Women with certain types of cancer, liver problems, or problems with blood clots should not take estrogen. Your doctor can help you decide the best treatment for your symptoms. Document Released: 12/02/2007 Document Revised: 09/07/2011 Document Reviewed: 12/02/2007 [facility] Patient Information 2014 [place], [place].

## 2013-11-08 NOTE — Progress Notes (Signed)
  Subjective:     Mary Gill is a 53 y.o. female and is here for a comprehensive physical exam. The patient reports problems - related to vaginal atrophy since menopause and very painful intercourse.  No cycle x 3-4 years.  Previously placed on Premarin which she did not take regularly and was ineffective.  She reports previously having acute MI, has not seen cardiology in last 8 years.  History   Social History  . Marital Status: Married    Spouse Name: N/A    Number of Children: N/A  . Years of Education: N/A   Occupational History  . Not on file.   Social History Main Topics  . Smoking status: Never Smoker   . Smokeless tobacco: Not on file  . Alcohol Use: Yes     Comment: occassionally  . Drug Use: No  . Sexual Activity: Yes    Partners: Male    Birth Control/ Protection: Post-menopausal   Other Topics Concern  . Not on file   Social History Narrative  . No narrative on file   Health Maintenance  Topic Date Due  . Tetanus/tdap  01/29/1978  . Colonoscopy  01/29/2009  . Mammogram  09/21/2013  . Influenza Vaccine  01/27/2014  . Pap Smear  10/15/2014    The following portions of the patient's history were reviewed and updated as appropriate: allergies, current medications, past family history, past medical history, past social history, past surgical history and problem list.  Review of Systems Pertinent items are noted in HPI.   Objective:    BP 119/83  Pulse 71  Ht 5\' 6"  (1.676 m)  Wt 158 lb (71.668 kg)  BMI 25.51 kg/m2 General appearance: alert, cooperative and appears stated age Head: Normocephalic, without obvious abnormality, atraumatic Neck: no adenopathy, supple, symmetrical, trachea midline and thyroid not enlarged, symmetric, no tenderness/mass/nodules Lungs: clear to auscultation bilaterally Breasts: normal appearance, no masses or tenderness Heart: regular rate and rhythm Abdomen: soft, non-tender; bowel sounds normal; no masses,  no  organomegaly Pelvic: cervix normal in appearance, external genitalia normal, no adnexal masses or tenderness, no cervical motion tenderness, uterus normal size, shape, and consistency and vaginal atrophy Extremities: extremities normal, atraumatic, no cyanosis or edema Skin: Skin color, texture, turgor normal. No rashes or lesions Lymph nodes: Cervical, supraclavicular, and axillary nodes normal. Neurologic: Grossly normal    Assessment:    Healthy female exam. Menopause, history of MI     Plan:  Pap smear Mammogram Yearly labs Referral to GI for colonoscopy Daily ASA Referral to Cards Trial of Premarin cream--little systemic absorption and no need for progesterone for uterine protection and ok for h/o MI for atrophic vagina and dyspareunia so doubt .   See After Visit Summary for Counseling Recommendations

## 2013-12-01 ENCOUNTER — Encounter: Payer: Self-pay | Admitting: Internal Medicine

## 2013-12-21 ENCOUNTER — Encounter: Payer: Self-pay | Admitting: *Deleted

## 2013-12-28 ENCOUNTER — Encounter: Payer: Self-pay | Admitting: Cardiology

## 2013-12-28 ENCOUNTER — Ambulatory Visit (INDEPENDENT_AMBULATORY_CARE_PROVIDER_SITE_OTHER): Payer: BC Managed Care – HMO | Admitting: Cardiology

## 2013-12-28 VITALS — BP 120/66 | HR 66 | Ht 66.0 in | Wt 159.0 lb

## 2013-12-28 DIAGNOSIS — I252 Old myocardial infarction: Secondary | ICD-10-CM

## 2013-12-28 NOTE — Patient Instructions (Addendum)
Your physician recommends that you continue on your current medications as directed. Please refer to the Current Medication list given to you today.  Your physician has requested that you have en exercise stress myoview. For further information please visit [email or web address]. Please follow instruction sheet, as given.  Your physician wants you to follow-up in: [place] will receive a reminder letter in the mail two months in advance. If you don't receive a letter, please call our office to schedule the follow-up appointment.  Information on red yeast rice was provided

## 2013-12-28 NOTE — Progress Notes (Signed)
Patient ID: Mary Gill, female   DOB: [birth year], 53 y.o.   MRN: [id number]     Patient Name: Mary Gill Date of Encounter: 12/28/2013  Primary Care Provider:  No primary provider on file. Primary Cardiologist: Dorothy Spark   Problem List   Past Medical History  Diagnosis Date  . Blood transfusion     x 61yrs ago/  . CHF (congestive heart failure)   . Myocardial infarction 2005  . Thyroid disease 2008    goiter   Past Surgical History  Procedure Laterality Date  . Breast enhancement surgery  1989  . Cholecystectomy  2007  . Tubal ligation  2004  . External ear surgery     Allergies  No Known Allergies  HPI  53 year old female with prior medical history of non-STEMI and cardiac catheterization in 4166 complicated by retroperitoneal hematoma. The patient was found to have totally occluded distal OM1 and was treated medically. She admits to not be compliant with her medicines, she stopped using statins, she took aspirin most of the time and stop using blood pressure medications due to severe hypotension and fatigue. She still works in [employer] (medical) and states that she is asymptomatic. She is trying to exercise multiple times a week has no symptoms. She denies any palpitations or syncope, lower extremity edema, orthopnea or paroxysmal nocturnal dyspnea. She is now postmenopausal and trying to reestablish care. She hasn't seen a cardiologist in the last 10 years.  Home Medications  Prior to Admission medications   Medication Sig Start Date End Date Taking? Authorizing Provider  aspirin 81 MG chewable tablet Chew 1 tablet (81 mg total) by mouth daily. 11/08/13  Yes Donnamae Jude, MD  conjugated estrogens (PREMARIN) vaginal cream 1 gram per vagina 2-3 times per week 11/08/13  Yes Donnamae Jude, MD   Family History  Family History  Problem Relation Age of Onset  . Alzheimer's disease Father   . Breast cancer Maternal Aunt 47  . Alzheimer's disease Paternal Aunt   .  Alzheimer's disease Paternal Uncle     Social History  History   Social History  . Marital Status: Married    Spouse Name: N/A    Number of Children: N/A  . Years of Education: N/A   Occupational History  . Not on file.   Social History Main Topics  . Smoking status: Never Smoker   . Smokeless tobacco: Not on file  . Alcohol Use: Yes     Comment: occassionally  . Drug Use: No  . Sexual Activity: Yes    Partners: Male    Birth Control/ Protection: Post-menopausal   Other Topics Concern  . Not on file   Social History Narrative  . No narrative on file     Review of Systems, as per HPI, otherwise negative General:  No chills, fever, night sweats or weight changes.  Cardiovascular:  No chest pain, dyspnea on exertion, edema, orthopnea, palpitations, paroxysmal nocturnal dyspnea. Dermatological: No rash, lesions/masses Respiratory: No cough, dyspnea Urologic: No hematuria, dysuria Abdominal:   No nausea, vomiting, diarrhea, bright red blood per rectum, melena, or hematemesis Neurologic:  No visual changes, wkns, changes in mental status. All other systems reviewed and are otherwise negative except as noted above.  Physical Exam  Blood pressure 120/66, pulse 66, height 5\' 6"  (1.676 m), weight 159 lb (72.122 kg).  General: Pleasant, NAD Psych: Normal affect. Neuro: Alert and oriented X 3. Moves all extremities spontaneously. HEENT: Normal  Neck: Supple without  bruits or JVD. Lungs:  Resp regular and unlabored, CTA. Heart: RRR no s3, s4, or murmurs. Abdomen: Soft, non-tender, non-distended, BS + x 4.  Extremities: No clubbing, cyanosis or edema. DP/PT/Radials 2+ and equal bilaterally.  Labs:  No results found for this basename: CKTOTAL, CKMB, TROPONINI,  in the last 72 hours Lab Results  Component Value Date   WBC 5.8 11/08/2013   HGB 13.2 11/08/2013   HCT 38.3 11/08/2013   MCV 90.1 11/08/2013   PLT 343 11/08/2013    No results found for this basename: DDIMER    No components found with this basename: POCBNP,     Component Value Date/Time   NA 138 11/08/2013 1058   K 4.5 11/08/2013 1058   CL 103 11/08/2013 1058   CO2 25 11/08/2013 1058   GLUCOSE 85 11/08/2013 1058   BUN 18 11/08/2013 1058   CREATININE 0.72 11/08/2013 1058   CREATININE 0.68 05/22/2009 2106   CALCIUM 9.6 11/08/2013 1058   PROT 6.9 11/08/2013 1058   ALBUMIN 4.2 11/08/2013 1058   AST 15 11/08/2013 1058   ALT 11 11/08/2013 1058   ALKPHOS 77 11/08/2013 1058   BILITOT 0.5 11/08/2013 1058   Lab Results  Component Value Date   CHOL 191 11/08/2013   HDL 101 11/08/2013   LDLCALC 81 11/08/2013   TRIG 47 11/08/2013    Accessory Clinical Findings  Echocardiogram - LEFT VENTRICLE: - Overall left ventricular systolic function was normal. - Left ventricular ejection fraction was estimated , range being 55 % to 65 %.. - There were no left ventricular regional wall motion abnormalities. AORTIC VALVE: - The aortic valve was trileaflet. - Aortic valve thickness was normal. AORTA: - The aortic root was normal in size. MITRAL VALVE: - There is a suggetion of prolapse of posterior mitral leaflet with mild MR which is also posteriorly directed. Doppler interpretation(s): - There was mild mitral valvular regurgitation. - The mitral regurgitation jet was eccentric and directed posteriorly. LEFT ATRIUM: - Left atrial size was normal. RIGHT VENTRICLE: - Right ventricular size was normal. - Right ventricular systolic function was normal.  ECG - normal sinus rhythm, lateral infarct age undetermined, inferior infarct age undetermined,   Cath 2005:  1. There was a large left main with no significant disease.  2. The LAD is a large vessel that coursed to the apex with no significant  disease.  3. The left coronary ostium gives rise to a large ramus intermedius, which  bifurcates in the midsegment and has no significant disease.  4. Left circumflex is a large vessel that coursed to the AV  groove and gives  rise to one large obtuse marginal branch. The AV circumflex has no  significant disease. The first OM was a large vessel which bifurcates in  the midsegment into an upper and lower branch. There is no significant  disease in the proximal portion of the OM. The upper bifurcation appears  normal. The lower bifurcation appears to have a 99% totally-occluded  distal aspect with very faint bridging collaterals to a very small distal  portion of the OM.  5. The right coronary artery is a large vessel that is dominant. It gives  rise to a PDA as well as a posterolateral branch. There is no  significant disease in the RCA, PDA, or posterolateral branch.  6. Left ventriculogram reveals a low normal EF of 50% with inferior and  apical hypokinesis.  7. There does appear to be mitral valve prolapse with mild to  moderate  mitral regurgitation.  8. Ascending aortography reveals no evidence of dissection or aortic  regurgitation.  HEMODYNAMICS: Systemic arterial pressure 111/73, LV __________ pressure  102/9, LVEDP of 21.   CONCLUSION:  1. Significant one-vessel coronary artery disease involving the terminal  portion of the distal first obtuse marginal.  2. Low normal ejection fraction with inferior and apical hypokinesis.  3. Mitral valve prolapse with mild to moderate mitral regurgitation.  4. No evidence of aortic dissection or aortic regurgitation.   ECG  Assessment & Plan  53 year old female  1. known CAD - we'll order an exercise nuclear stress test to evaluate her functional capacity, evaluate for LV EF, prior scars and possible ischemia. She is scheduled for colonoscopy and will require clearance.  2. blood pressure - controlled and won't a low any initiation of beta blocker or ACE inhibitor. If there is finding of LV dysfunction we'll try to start her low dose of ACE inhibitor.  3. Lipids - she has excellent lipid panel however with no prior myocardial infarction he  would like her to be on statin. She is reluctant she is advised to use over-the-counter red yeast rice.  If normal stress test followup in 1 year.    Dorothy Spark, MD, [facility] 12/28/2013, 7:58 AM

## 2014-01-09 ENCOUNTER — Ambulatory Visit (HOSPITAL_COMMUNITY): Payer: BC Managed Care – HMO

## 2014-01-10 ENCOUNTER — Ambulatory Visit: Payer: BC Managed Care – HMO | Admitting: Family Medicine

## 2014-01-17 ENCOUNTER — Other Ambulatory Visit: Payer: Self-pay | Admitting: *Deleted

## 2014-01-17 DIAGNOSIS — N952 Postmenopausal atrophic vaginitis: Secondary | ICD-10-CM

## 2014-01-22 MED ORDER — ESTROGENS, CONJUGATED 0.625 MG/GM VA CREA: TOPICAL_CREAM | VAGINAL | Status: DC

## 2014-01-31 ENCOUNTER — Other Ambulatory Visit: Payer: Self-pay | Admitting: *Deleted

## 2014-01-31 DIAGNOSIS — Z8619 Personal history of other infectious and parasitic diseases: Secondary | ICD-10-CM

## 2014-01-31 MED ORDER — ACYCLOVIR 200 MG PO CAPS: 200.0000 mg | ORAL_CAPSULE | Freq: Two times a day (BID) | ORAL | Status: DC

## 2014-01-31 NOTE — Telephone Encounter (Signed)
Patient called and needed a refill on Zovirax.  I have sent in a refill to her pharmacy.

## 2014-02-07 ENCOUNTER — Ambulatory Visit (AMBULATORY_SURGERY_CENTER): Payer: BC Managed Care – PPO | Admitting: *Deleted

## 2014-02-07 VITALS — Ht 66.0 in | Wt 159.5 lb

## 2014-02-07 DIAGNOSIS — Z1211 Encounter for screening for malignant neoplasm of colon: Secondary | ICD-10-CM

## 2014-02-07 MED ORDER — MOVIPREP 100 G PO SOLR: ORAL | Status: DC

## 2014-02-07 NOTE — Progress Notes (Signed)
No allergies to eggs or soy. No problems with anesthesia.  Pt given Emmi instructions for colonoscopy  No oxygen use  No diet drug use  Pt had OV with Dr Ottie Glazier on 7/2. In Assessment and Plan she wrote: " known CAD - we'll order an exercise nuclear stress test to evaluate her functional capacity, evaluate for LV EF, prior scars and possible ischemia. She is scheduled for colonoscopy and will require clearance."   Pt says during PV that she did not have stress test because insurance did not cover test..  Pt scheduled for OV with Alonza Bogus Friday 8/14.  PV was completed and prep Rx sent to pharmacy.  Colonoscopy scheduled for 8/26 was not cancelled depending on outcome of OV With Jessica on Friday 8/14.

## 2014-02-09 ENCOUNTER — Telehealth: Payer: Self-pay | Admitting: Cardiology

## 2014-02-09 ENCOUNTER — Ambulatory Visit: Payer: BC Managed Care – PPO | Admitting: Gastroenterology

## 2014-02-09 ENCOUNTER — Telehealth: Payer: Self-pay | Admitting: *Deleted

## 2014-02-09 NOTE — Telephone Encounter (Signed)
Follow up     Pt having colonoscopy on 8-16 by Dr Lorre Munroe clearance

## 2014-02-09 NOTE — Telephone Encounter (Signed)
New message     Pt needs to be cleared for a colonoscopy on 8-26 but her ins will not pay for a nuclear stress test..  Can dr Meda Coffee clear her without having a nuclear stress test?

## 2014-02-09 NOTE — Telephone Encounter (Signed)
[facility] calling to ask if Dr Meda Coffee would consider clearing the pt from a cardiac standpoint to have a colonoscopy done by Dr Olevia Perches.  Informed Claiborne Billings at [facility] according to Dr Francesca Oman dictation notes from 7/2 , the pt has known CAD and will need an order for an exercise nuclear stress test to evaluate her functional capacity, evaluate for LV EF, prior scars and possible ischemia, before she is scheduled for a colonoscopy. Pt had a scheduled appt to get a exercise stress myoview on 7/14 and cancelled the appt.  Claiborne Billings for [facility] stated that the pt told her the reason why she cancelled it was due to financial reasons.  Informed Claiborne Billings that its important for the pt to have this done before clearance, and we will help her by applying for hardship. Informed Claiborne Billings that the pt never notified our office of cancelling the appt due to financial issues.  Informed Claiborne Billings that Dr Meda Coffee is out of the office today, but I will route this message to her for further review and recommendation.  Claiborne Billings states that she will call the pt and explain the importance of being cleared from a cardiac standpoint and proceeding with getting the stress test done.  Claiborne Billings states she will tell the pt to contact our office to reschedule myoview.  Claiborne Billings very appreciative of all the assistance provided.

## 2014-02-09 NOTE — Telephone Encounter (Signed)
Mary Bogus, PA-C reviewed patients chart and advised patient does not need an office visit.  Patient needs clearance from cardiology but requires patient to get stress test per note from cardiology below:  1. known CAD - we'll order an exercise nuclear stress test to evaluate her functional capacity, evaluate for LV EF, prior scars and possible ischemia. She is scheduled for colonoscopy and will require clearance.  Per other office notes patient refused stress test because of cost. Patient has colonoscopy and instructions for colonoscopy, patient needs cardiology to send clearance saying it is okay for patient to proceed.  I called patient and advised we are cancelling the office appointment, she just needs to do stress test or get cardiology to clear her and have them send [place] the note clearing her before her procedure that is scheduled on 02-21-2014 OR we have to cancel the colonoscopy  So office visit is not necessary because patient just needs Cardiology to clear her. Patient is calling her Cardiologist to request clearance or stress test.  I called Mary's office and left a message for a call back

## 2014-02-09 NOTE — Telephone Encounter (Signed)
Katarina's nurse Karlene Einstein from Cardiology called back. Per Ivy patient will need a Stress Test done, patient has had stents in the past and has heart issues. Karlene Einstein said patient can call their office back to schedule stress test they are willing to work with her, they have a hardship program.  I called patient LMOM for call back Colonoscopy cancelled

## 2014-02-12 ENCOUNTER — Encounter: Payer: Self-pay | Admitting: *Deleted

## 2014-02-12 NOTE — Telephone Encounter (Signed)
Spoke with Claiborne Billings CMA at [facility] to inform her that per Dr Meda Coffee the pt is cleared for her upcoming colonoscopy.  Kelly verbalized understanding.

## 2014-02-12 NOTE — Telephone Encounter (Signed)
Contacted pt to ask her per Dr Meda Coffee, if she is having any chest pain or sob on exertion, and if she can walk a full block without becoming symptomatic.  Pt denies any sob on exertion, and chest pain.  Pt states "I'm able to run a mile with no complications." Informed pt that Dr Meda Coffee said if she has none of the above complaints, then she will clear her to have a colonoscopy done next week.  Pt so very grateful for all the assistance provided.  Will inform [facility] of the clearance as well.

## 2014-02-12 NOTE — Telephone Encounter (Signed)
Ivy, Please call her and as her if she has any chest pain or SOB on exertion. Also if she could walk 1 block. If yes, we ca clear her. Thank you, KN

## 2014-02-21 ENCOUNTER — Encounter: Payer: BC Managed Care – HMO | Admitting: Internal Medicine

## 2014-02-21 ENCOUNTER — Encounter: Payer: Self-pay | Admitting: Internal Medicine

## 2014-02-21 ENCOUNTER — Ambulatory Visit (AMBULATORY_SURGERY_CENTER): Payer: BC Managed Care – PPO | Admitting: Internal Medicine

## 2014-02-21 VITALS — BP 118/74 | HR 65 | Temp 96.2°F | Resp 24 | Ht 66.0 in | Wt 159.0 lb

## 2014-02-21 DIAGNOSIS — D126 Benign neoplasm of colon, unspecified: Secondary | ICD-10-CM

## 2014-02-21 DIAGNOSIS — Z1211 Encounter for screening for malignant neoplasm of colon: Secondary | ICD-10-CM

## 2014-02-21 MED ORDER — SODIUM CHLORIDE 0.9 % IV SOLN: 500.0000 mL | INTRAVENOUS | Status: DC

## 2014-02-21 NOTE — Progress Notes (Signed)
Called to room to assist during endoscopic procedure.  Patient ID and intended procedure confirmed with present staff. Received instructions for my participation in the procedure from the performing physician.  

## 2014-02-21 NOTE — Op Note (Signed)
[facility]  [address] [place], [id number]   COLONOSCOPY PROCEDURE REPORT  PATIENT: Mary, Gill  MR#: [id number] BIRTHDATE: 12/14/1960 , 41  yrs. old GENDER: Female ENDOSCOPIST: Lafayette Dragon, MD REFERRED BY:Dr Ena Dawley PROCEDURE DATE:  02/21/2014 PROCEDURE:   Colonoscopy with snare polypectomy First Screening Colonoscopy - Avg.  risk and is 50 yrs.  old or older Yes.  Prior Negative Screening - Now for repeat screening. N/A  History of Adenoma - Now for follow-up colonoscopy & has been > or = to 3 yrs.  N/A  Polyps Removed Today? Yes. ASA CLASS:   Class III INDICATIONS:Average risk patient for colon cancer. MEDICATIONS: MAC sedation, administered by CRNA and Propofol (Diprivan) 330 mg IV  DESCRIPTION OF PROCEDURE:   After the risks benefits and alternatives of the procedure were thoroughly explained, informed consent was obtained.  A digital rectal exam revealed no abnormalities of the rectum.   The LB [id number]  endoscope was introduced through the anus and advanced to the cecum, which was identified by both the appendix and ileocecal valve. No adverse events experienced.   The quality of the prep was good, using MoviPrep  The instrument was then slowly withdrawn as the colon was fully examined.      COLON FINDINGS: A polypoid shaped semi-pedunculated polyp measuring 12 mm in size was found in the sigmoid colon.  A polypectomy was performed using snare cautery.  The resection was complete and the polyp tissue was completely retrieved.   Mild diverticulosis was noted in the sigmoid colon.  Retroflexed views revealed no abnormalities. The time to cecum=5 minutes 11 seconds.  Withdrawal time=11 minutes 24 seconds.  The scope was withdrawn and the procedure completed. COMPLICATIONS: There were no complications.  ENDOSCOPIC IMPRESSION: 1.   Semi-pedunculated polyp measuring 12 mm in size was found in the sigmoid colon; polypectomy was  performed using snare cautery 2.   Mild diverticulosis was noted in the sigmoid colon  RECOMMENDATIONS: 1.  Await pathology results 2.  high fiber diet No aspirin or anti-inflammatory agents for 2 weeks Recall colonoscopy pending path report   eSigned:  Lafayette Dragon, MD 02/21/2014 10:37 AM   cc:   PATIENT NAME:  Mary, Gill MR#: [id number]

## 2014-02-21 NOTE — Patient Instructions (Signed)
Discharge instructions given with verbal understanding. Handout on polyps. Resume previous medications. Hold aspirin and aspirin products for 2 weeks. YOU HAD AN ENDOSCOPIC PROCEDURE TODAY AT [facility]: Refer to the procedure report that was given to you for any specific questions about what was found during the examination.  If the procedure report does not answer your questions, please call your gastroenterologist to clarify.  If you requested that your care partner not be given the details of your procedure findings, then the procedure report has been included in a sealed envelope for you to review at your convenience later.  YOU SHOULD EXPECT: Some feelings of bloating in the abdomen. Passage of more gas than usual.  Walking can help get rid of the air that was put into your GI tract during the procedure and reduce the bloating. If you had a lower endoscopy (such as a colonoscopy or flexible sigmoidoscopy) you may notice spotting of blood in your stool or on the toilet paper. If you underwent a bowel prep for your procedure, then you may not have a normal bowel movement for a few days.  DIET: Your first meal following the procedure should be a light meal and then it is ok to progress to your normal diet.  A half-sandwich or bowl of soup is an example of a good first meal.  Heavy or fried foods are harder to digest and may make you feel nauseous or bloated.  Likewise meals heavy in dairy and vegetables can cause extra gas to form and this can also increase the bloating.  Drink plenty of fluids but you should avoid alcoholic beverages for 24 hours.  ACTIVITY: Your care partner should take you home directly after the procedure.  You should plan to take it easy, moving slowly for the rest of the day.  You can resume normal activity the day after the procedure however you should NOT DRIVE or use heavy machinery for 24 hours (because of the sedation medicines used during the test).     SYMPTOMS TO REPORT IMMEDIATELY: A gastroenterologist can be reached at any hour.  During normal business hours, 8:30 AM to 5:00 PM Monday through Friday, call [phone].  After hours and on weekends, please call the GI answering service at [phone] who will take a message and have the physician on call contact you.   Following lower endoscopy (colonoscopy or flexible sigmoidoscopy):  Excessive amounts of blood in the stool  Significant tenderness or worsening of abdominal pains  Swelling of the abdomen that is new, acute  Fever of 100F or higher  FOLLOW UP: If any biopsies were taken you will be contacted by phone or by letter within the next 1-3 weeks.  Call your gastroenterologist if you have not heard about the biopsies in 3 weeks.  Our staff will call the home number listed on your records the next business day following your procedure to check on you and address any questions or concerns that you may have at that time regarding the information given to you following your procedure. This is a courtesy call and so if there is no answer at the home number and we have not heard from you through the emergency physician on call, we will assume that you have returned to your regular daily activities without incident.  SIGNATURES/CONFIDENTIALITY: You and/or your care partner have signed paperwork which will be entered into your electronic medical record.  These signatures attest to the fact that that the information  above on your After Visit Summary has been reviewed and is understood.  Full responsibility of the confidentiality of this discharge information lies with you and/or your care-partner. 

## 2014-02-21 NOTE — Progress Notes (Signed)
Report to PACU, RN, vss, BBS= Clear.  

## 2014-02-22 ENCOUNTER — Telehealth: Payer: Self-pay

## 2014-02-22 NOTE — Telephone Encounter (Signed)
Left message on answering machine. 

## 2014-02-26 ENCOUNTER — Encounter: Payer: Self-pay | Admitting: Internal Medicine

## 2014-02-28 ENCOUNTER — Encounter: Payer: Self-pay | Admitting: *Deleted

## 2014-04-30 ENCOUNTER — Encounter: Payer: Self-pay | Admitting: Internal Medicine

## 2014-12-18 ENCOUNTER — Other Ambulatory Visit: Payer: Self-pay | Admitting: Family Medicine

## 2015-05-09 ENCOUNTER — Telehealth: Payer: Self-pay

## 2015-05-09 NOTE — Telephone Encounter (Signed)
Returning a phone call to [facility] to schedule an annual exam, busy sound, could not leave a message.

## 2015-05-10 ENCOUNTER — Telehealth: Payer: Self-pay

## 2015-05-10 NOTE — Telephone Encounter (Signed)
Second attempt to return a phone call to schedule an annual exam, no answer

## 2015-06-11 ENCOUNTER — Encounter: Payer: Self-pay | Admitting: *Deleted

## 2016-01-08 DIAGNOSIS — H906 Mixed conductive and sensorineural hearing loss, bilateral: Secondary | ICD-10-CM | POA: Diagnosis not present

## 2016-02-20 DIAGNOSIS — H903 Sensorineural hearing loss, bilateral: Secondary | ICD-10-CM | POA: Diagnosis not present

## 2016-02-20 DIAGNOSIS — H8003 Otosclerosis involving oval window, nonobliterative, bilateral: Secondary | ICD-10-CM | POA: Diagnosis not present

## 2016-02-20 DIAGNOSIS — H8023 Cochlear otosclerosis, bilateral: Secondary | ICD-10-CM | POA: Diagnosis not present

## 2016-06-17 DIAGNOSIS — Z803 Family history of malignant neoplasm of breast: Secondary | ICD-10-CM | POA: Diagnosis not present

## 2016-06-17 DIAGNOSIS — Z1231 Encounter for screening mammogram for malignant neoplasm of breast: Secondary | ICD-10-CM | POA: Diagnosis not present

## 2016-06-18 ENCOUNTER — Encounter: Payer: Self-pay | Admitting: *Deleted

## 2016-06-18 DIAGNOSIS — L309 Dermatitis, unspecified: Secondary | ICD-10-CM | POA: Diagnosis not present

## 2016-06-18 DIAGNOSIS — D229 Melanocytic nevi, unspecified: Secondary | ICD-10-CM | POA: Diagnosis not present

## 2016-06-18 DIAGNOSIS — Z1283 Encounter for screening for malignant neoplasm of skin: Secondary | ICD-10-CM | POA: Diagnosis not present

## 2016-06-18 DIAGNOSIS — D485 Neoplasm of uncertain behavior of skin: Secondary | ICD-10-CM | POA: Diagnosis not present

## 2016-06-18 DIAGNOSIS — L812 Freckles: Secondary | ICD-10-CM | POA: Diagnosis not present

## 2016-06-18 DIAGNOSIS — D2261 Melanocytic nevi of right upper limb, including shoulder: Secondary | ICD-10-CM | POA: Diagnosis not present

## 2017-01-05 DIAGNOSIS — H2513 Age-related nuclear cataract, bilateral: Secondary | ICD-10-CM | POA: Diagnosis not present

## 2017-01-05 DIAGNOSIS — H43812 Vitreous degeneration, left eye: Secondary | ICD-10-CM | POA: Diagnosis not present

## 2017-01-05 DIAGNOSIS — H04129 Dry eye syndrome of unspecified lacrimal gland: Secondary | ICD-10-CM | POA: Diagnosis not present

## 2017-01-05 DIAGNOSIS — H524 Presbyopia: Secondary | ICD-10-CM | POA: Diagnosis not present

## 2017-01-06 ENCOUNTER — Other Ambulatory Visit (HOSPITAL_COMMUNITY)
Admission: RE | Admit: 2017-01-06 | Discharge: 2017-01-06 | Disposition: A | Discharge status: Home / Self Care | Payer: BLUE CROSS/BLUE SHIELD | Source: Ambulatory Visit | Attending: Primary Care | Admitting: Primary Care

## 2017-01-06 ENCOUNTER — Ambulatory Visit (INDEPENDENT_AMBULATORY_CARE_PROVIDER_SITE_OTHER): Payer: BLUE CROSS/BLUE SHIELD | Admitting: Primary Care

## 2017-01-06 ENCOUNTER — Encounter: Payer: Self-pay | Admitting: Primary Care

## 2017-01-06 VITALS — BP 118/80 | HR 79 | Temp 97.9°F | Ht 64.5 in | Wt 139.8 lb

## 2017-01-06 DIAGNOSIS — I214 Non-ST elevation (NSTEMI) myocardial infarction: Secondary | ICD-10-CM

## 2017-01-06 DIAGNOSIS — Z124 Encounter for screening for malignant neoplasm of cervix: Secondary | ICD-10-CM

## 2017-01-06 DIAGNOSIS — I252 Old myocardial infarction: Secondary | ICD-10-CM | POA: Diagnosis not present

## 2017-01-06 DIAGNOSIS — N941 Unspecified dyspareunia: Secondary | ICD-10-CM

## 2017-01-06 DIAGNOSIS — A6000 Herpesviral infection of urogenital system, unspecified: Secondary | ICD-10-CM

## 2017-01-06 DIAGNOSIS — N9419 Other specified dyspareunia: Secondary | ICD-10-CM | POA: Insufficient documentation

## 2017-01-06 LAB — COMPREHENSIVE METABOLIC PANEL
ALBUMIN: 4.2 g/dL (ref 3.5–5.2)
ALT: 16 U/L (ref 0–35)
AST: 16 U/L (ref 0–37)
Alkaline Phosphatase: 67 U/L (ref 39–117)
BILIRUBIN TOTAL: 0.5 mg/dL (ref 0.2–1.2)
BUN: 19 mg/dL (ref 6–23)
CO2: 30 mEq/L (ref 19–32)
Calcium: 9.7 mg/dL (ref 8.4–10.5)
Chloride: 105 mEq/L (ref 96–112)
Creatinine, Ser: 0.74 mg/dL (ref 0.40–1.20)
GFR: 85.69 mL/min (ref >60.00)
Glucose, Bld: 85 mg/dL (ref 70–99)
POTASSIUM: 4.2 meq/L (ref 3.5–5.1)
Sodium: 140 mEq/L (ref 135–145)
TOTAL PROTEIN: 7.1 g/dL (ref 6.0–8.3)

## 2017-01-06 LAB — LIPID PANEL
CHOLESTEROL: 206 mg/dL — AB (ref 0–200)
HDL: 115 mg/dL (ref >39.00)
LDL Cholesterol: 84 mg/dL (ref 0–99)
NonHDL: 90.94
Total CHOL/HDL Ratio: 2
Triglycerides: 36 mg/dL (ref 0.0–149.0)
VLDL: 7.2 mg/dL (ref 0.0–40.0)

## 2017-01-06 MED ORDER — ACYCLOVIR 400 MG PO TABS: ORAL_TABLET | ORAL | 1 refills | Status: DC

## 2017-01-06 NOTE — Progress Notes (Signed)
Subjective:    Patient ID: Mary Gill, female    DOB: 11-16-1960, 56 y.o.   MRN: [id number]  HPI  Mary Gill is a 56 year old female who presents today to establish care and discuss the problems mentioned below. Will obtain old records.  1) NSTEMI: Occurred in 2003. Currently managed on aspirin 81 mg. No history of STENT placement. Not currently following with cardiologist as she was released several years ago. She denies chest pain, dizziness, shortness of breath, visual changes.  2) Painful Intercourse: Present for the past 3-4 years ago, worse over the past 2 years. She was prescribed Premarin cream per GYN in the past with improvement on the outer vagina, but no improvement on the internal region. She was told that she was very dry. Pain is present during all parts of intercourse. She's also tried numerous lubrications. She's only having intercourse twice annually given these symptoms.  She denies vaginal discharge, vaginal itching, vaginal bleeding.   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   Review of Systems  Constitutional: Negative for fatigue.  Eyes: Negative for visual disturbance.  Respiratory: Negative for shortness of breath.   Cardiovascular: Negative for chest pain.  Genitourinary: Positive for dyspareunia. Negative for difficulty urinating, frequency, hematuria, pelvic pain, vaginal bleeding and vaginal discharge.  Neurological: Negative for dizziness and headaches.       Past Medical History:  Diagnosis Date  . Allergy   . Blood transfusion    2005 after heart cath  . Blood transfusion without reported diagnosis   . CHF (congestive heart failure) ([place])   . Myocardial infarction ([place]) 2005  . Thyroid disease 2008   goiter     Social History   Social History  . Marital status: Married    Spouse name: N/A  . Number of children: N/A  . Years of education: N/A   Occupational History  . Not on file.     Social History Main Topics  . Smoking status: Never Smoker  . Smokeless tobacco: Never Used  . Alcohol use 2.4 oz/week    4 Glasses of wine per week  . Drug use: No  . Sexual activity: Yes    Partners: Male    Birth control/ protection: Post-menopausal   Other Topics Concern  . Not on file   Social History Narrative   Married.   1 child. 1 grandchild.   Works at [employer].   Enjoys golfing, exercising, spending with friends.     Past Surgical History:  Procedure Laterality Date  . bilateral breast augmentation  1989  . CARDIAC CATHETERIZATION  2005   after MI; bleed at point of entry; received 3 units blood  . CHOLECYSTECTOMY  2007  . STAPEDECTOMY Bilateral 2000  . TUBAL LIGATION  2004    Family History  Problem Relation Age of Onset  . Alzheimer's disease Father   . Breast cancer Maternal Aunt 40  . Alzheimer's disease Paternal Aunt   . Alzheimer's disease Paternal Uncle   . Breast cancer Sister 103       07/2014  . Colon cancer Neg Hx   . Esophageal cancer Neg Hx   . Stomach cancer Neg Hx   . Rectal cancer Neg Hx     No Known Allergies  Current Outpatient Prescriptions on File Prior to Visit  Medication Sig Dispense Refill  . aspirin 81 MG chewable tablet Chew 1 tablet (81 mg total) by mouth  daily. 90 tablet 3   No current facility-administered medications on file prior to visit.     BP 118/80   Pulse 79   Temp 97.9 F (36.6 C) (Oral)   Ht 5' 4.5" (1.638 m)   Wt 139 lb 12.8 oz (63.4 kg)   SpO2 97%   BMI 23.63 kg/m    Objective:   Physical Exam  Constitutional: She appears well-nourished.  Neck: Neck supple.  Cardiovascular: Normal rate and regular rhythm.   Pulmonary/Chest: Effort normal and breath sounds normal.  Genitourinary: There is no rash, tenderness or lesion on the right labia. There is no rash, tenderness or lesion on the left labia. Cervix exhibits discharge. Cervix exhibits no motion tenderness. Right adnexum displays  tenderness. Right adnexum displays no mass. Left adnexum displays tenderness. Left adnexum displays no mass. No vaginal discharge found.  Genitourinary Comments: External and internal dryness. Internal erythema. Mild whitish discharge to cervix.  Skin: Skin is warm and dry.          Assessment & Plan:

## 2017-01-06 NOTE — Assessment & Plan Note (Signed)
Ongoing for 3-4 years, worse over last 2 years. Check Pap, RPR, gonorrhea, chlamydia, wet prep today. Exam today with moderate erythema and dryness. Consider GYN referral once labs return.

## 2017-01-06 NOTE — Addendum Note (Signed)
Addended by: Jacqualin Combes on: 01/06/2017 10:26 AM   Modules accepted: Orders

## 2017-01-06 NOTE — Assessment & Plan Note (Signed)
Diagnosed in 2003, currently managed on aspirin daily. Check lipids, CMP today. Commended her on regular exercise.

## 2017-01-06 NOTE — Patient Instructions (Addendum)
I sent a new prescription for acyclovir 400 mg tablets to your pharmacy. Take 1 tablet three times daily for 5 days during outbreaks.  Complete lab work prior to leaving today. I will notify you of your results once received.   I will be in touch with you once we receive your vaginal swabs.  It was a pleasure to meet you today! Please don't hesitate to call me with any questions. Welcome to [facility]!

## 2017-01-06 NOTE — Assessment & Plan Note (Signed)
Infrequent outbreaks. Refilled acyclovir.

## 2017-01-07 ENCOUNTER — Other Ambulatory Visit: Payer: Self-pay | Admitting: Primary Care

## 2017-01-07 LAB — GC/CHLAMYDIA PROBE AMP
CT Probe RNA: NOT DETECTED
GC Probe RNA: NOT DETECTED

## 2017-01-07 LAB — WET PREP BY MOLECULAR PROBE
CANDIDA SPECIES: NOT DETECTED
GARDNERELLA VAGINALIS: NOT DETECTED
Trichomonas vaginosis: NOT DETECTED

## 2017-01-07 LAB — RPR

## 2017-01-08 ENCOUNTER — Encounter: Payer: Self-pay | Admitting: Primary Care

## 2017-01-08 DIAGNOSIS — N941 Unspecified dyspareunia: Secondary | ICD-10-CM

## 2017-01-12 LAB — CYTOLOGY - PAP
DIAGNOSIS: NEGATIVE
HPV: NOT DETECTED

## 2017-01-21 DIAGNOSIS — H25813 Combined forms of age-related cataract, bilateral: Secondary | ICD-10-CM | POA: Diagnosis not present

## 2017-02-12 ENCOUNTER — Encounter: Payer: Self-pay | Admitting: Obstetrics and Gynecology

## 2017-02-23 ENCOUNTER — Encounter: Payer: Self-pay | Admitting: Obstetrics & Gynecology

## 2017-02-23 ENCOUNTER — Ambulatory Visit (INDEPENDENT_AMBULATORY_CARE_PROVIDER_SITE_OTHER): Payer: BLUE CROSS/BLUE SHIELD | Admitting: Obstetrics & Gynecology

## 2017-02-23 VITALS — BP 120/80 | HR 75 | Ht 64.0 in | Wt 144.0 lb

## 2017-02-23 DIAGNOSIS — N9419 Other specified dyspareunia: Secondary | ICD-10-CM

## 2017-02-23 DIAGNOSIS — N952 Postmenopausal atrophic vaginitis: Secondary | ICD-10-CM | POA: Diagnosis not present

## 2017-02-23 MED ORDER — OSPEMIFENE 60 MG PO TABS: 1.0000 | ORAL_TABLET | Freq: Every day | ORAL | 11 refills | Status: DC

## 2017-02-23 NOTE — Patient Instructions (Addendum)
Dyspareunia, Female Dyspareunia is pain that is associated with sexual activity. This can affect any part of the genitals or lower abdomen, and there are many possible causes. This condition ranges from mild to severe. Depending on the cause, dyspareunia may get better with treatment, or it may return (recur) over time. What are the causes? The cause of this condition is not always known. Possible causes include:  Cancer.  Psychological factors, such as depression, anxiety, or previous traumatic experiences.  Severe pain and tenderness of the skin around the vagina (vulva) when it is touched (vulvar vestibulitis syndrome).  Infection of the pelvis or the vulva.  Infection of the vagina.  Painful, involuntary tightening (contraction) of the vaginal muscles when anything is put inside the vagina (vaginismus).  Allergic reaction.  Ovarian cysts.  Solid growths of tissue (tumors) in the ovaries or the uterus.  Scar tissue in the ovaries, vagina, or pelvis.  Vaginal dryness.  Thinning of the tissue (atrophy) of the vulva and vagina.  Skin conditions that affect the vulva (vulvar dermatoses), such as lichen sclerosus or lichen planus.  Endometriosis.  Tubal pregnancy.  A tilted uterus.  Uterine prolapse.  Adhesions in the vagina.  Bladder problems.  Intestinal problems.  Certain medicines.  Medical conditions such as diabetes, arthritis, or thyroid disease.  What increases the risk? The following factors may make you more likely to develop this condition:  Having experienced physical or sexual trauma.  Having given birth more than once.  Taking birth control pills.  Having gone through menopause.  Having recently given birth, typically within the past 3-6 months.  Breastfeeding.  What are the signs or symptoms? The main symptom of this condition is pain in any part of the genitals or lower abdomen during or after sexual activity. This may include pain  during sexual arousal, genital stimulation, or orgasm. Pain may get worse when anything is inserted into the vagina, or when the genitals are touched in any way, such as when sitting or wearing pants. Pain can range from mild to severe, depending on the cause of the condition. In some cases, symptoms go away with treatment and return (recur) at a later date. How is this diagnosed? This condition may be diagnosed based on:  Your symptoms, including: ? Where your pain is located. ? When your pain occurs.  Your medical history.  A physical exam. This may include a pelvic exam and a Pap test. This is a screening test that is used to check for signs of cancer of the vagina, cervix, and uterus. You may be referred to a health care provider who specializes in women's health (gynecologist). In some cases, diagnosing the cause of dyspareunia can be difficult. How is this treated? Treatment depends on the cause of your condition and your symptoms. In most cases, you may need to stop sexual activity until your symptoms improve. Treatment may include:  Lubricants.  Kegel exercises or vaginal dilators.  Medicated skin creams.  Medicated vaginal creams.  Hormonal therapy.  Antibiotic medicine to prevent or fight infection.  Medicines that help to relieve pain.  Medicines that treat depression (antidepressants).  Psychological counseling.  Sex therapy.  Surgery.  Follow these instructions at home: Lifestyle  Avoid tight clothing and irritating materials around your genital and abdominal area.  Use water-based lubricants as needed. Avoid oil-based lubricants.  Do not use any products that irritate you. This may include certain condoms, spermicides, lubricants, soaps, tampons, vaginal sprays, or douches.  Always practice safe sex.  Talk with your health care provider about which form of birth control (contraception) is best for you.  Maintain open communication with your sexual  partner. General instructions  Take over-the-counter and prescription medicines only as told by your health care provider.  If you had tests done, it is your responsibility to get your tests results. Ask your health care provider or the department performing the test when your results will be ready.  Urinate before you engage in sexual activity.  Consider joining a support group.  Keep all follow-up visits as told by your health care provider. This is important. Contact a health care provider if:  You develop vaginal bleeding after sexual intercourse.  You develop a lump at the opening of your vagina. Seek medical care even if the lump is painless.  You have: ? Abnormal vaginal discharge. ? Vaginal dryness. ? Itchiness or irritation of your vulva or vagina. ? A new rash. ? Symptoms that get worse or do not improve with treatment. ? A fever. ? Pain when you urinate. ? Blood in your urine. Get help right away if:  You develop severe pain in your abdomen during or shortly after sexual intercourse.  You pass out after having sexual intercourse. This information is not intended to replace advice given to you by your health care provider. Make sure you discuss any questions you have with your health care provider. Document Released: 07/05/2007 Document Revised: 10/25/2015 Document Reviewed: 01/15/2015 Elsevier Interactive Patient Education  2018 [place] oral tablets What is this medicine? OSPEMIFENE (os PEM i feen) is used to treat painful sexual intercourse in females after menopause, a symptom of menopause that occurs due to changes in and around the vagina. This medicine may be used for other purposes; ask your health care provider or pharmacist if you have questions. COMMON BRAND NAME(S): Osphena What should I tell my health care provider before I take this medicine? They need to know if you have any of these conditions: -cancer, such as breast, uterine, or  other cancer -heart disease -history of blood clots -history of stroke -history of vaginal bleeding -liver disease -premenopausal -smoke tobacco -an unusual or allergic reaction to ospemifene, other medicines, foods, dyes, or preservatives -pregnant or trying to get pregnant -breast-feeding How should I use this medicine? Take this medicine by mouth with a glass of water. Take this medicine with food. Follow the directions on the prescription label. Do not take your medicine more often than directed. Talk to your pediatrician regarding the use of this medicine in children. Special care may be needed. Overdosage: If you think you have taken too much of this medicine contact a poison control center or emergency room at once. NOTE: This medicine is only for you. Do not share this medicine with others. What if I miss a dose? If you miss a dose, take it as soon as you can. If it is almost time for your next dose, take only that dose. Do not take double or extra doses. What may interact with this medicine? -doxycycline -estrogens -fluconazole -furosemide -glyburide -ketoconazole -phenytoin -rifampin -warfarin This list may not describe all possible interactions. Give your health care provider a list of all the medicines, herbs, non-prescription drugs, or dietary supplements you use. Also tell them if you smoke, drink alcohol, or use illegal drugs. Some items may interact with your medicine. What should I watch for while using this medicine? Visit your health care professional for regular checks on your progress. You will  need a regular breast and pelvic exam and Pap smear while on this medicine. You should also discuss the need for regular mammograms with your health care professional, and follow his or her guidelines for these tests. Also, periodically discuss the need to continue taking this medicine. Taking this medicine for long periods of time may increase your risk for serious side  effects. This medicine can increase the risk of developing a condition (endometrial hyperplasia) that may lead to cancer of the lining of the uterus. Taking progestins, another hormone drug, with this medicine lowers the risk of developing this condition. Therefore, if your uterus has not been removed (by a hysterectomy), your doctor may prescribe a progestin for you to take together with your estrogen. You should know, however, that taking estrogens with progestins may have additional health risks. You should discuss the use of estrogens and progestins with your health care professional to determine the benefits and risks for you. This medicine can rarely cause blood clots. You should avoid long periods of bed rest while taking this medicine. If you are going to have surgery, tell your doctor or health care professional that you are taking this medicine. This medicine should be stopped at least 4-6 weeks before surgery. After surgery, it should be restarted only after you are walking again. It should not be restarted while you still need long periods of bed rest. You should not smoke while taking this medicine. Smoking may also increase your risk of blood clots. Smoking can also decrease the effects of this medicine. This medicine does not prevent hot flashes. It may cause hot flashes in some patients. If you have any reason to think you are pregnant; stop taking this medicine at once and contact your doctor or health care professional. What side effects may I notice from receiving this medicine? Side effects that you should report to your doctor or health care professional as soon as possible: -breathing problems -changes in vision -confusion, trouble speaking or understanding -new breast lumps -pain, swelling, warmth in the leg -pelvic pain or pressure -severe headaches -sudden chest pain -sudden numbness or weakness of the face, arm or leg -trouble walking, dizziness, loss of balance or  coordination -unusual vaginal bleeding patterns -vaginal discharge that is bloody or brown Side effects that usually do not require medical attention (report to your doctor or health care professional if they continue or are bothersome): -hot flushes or flashes -increased sweating -muscle cramps -vaginal discharge (white or clear) This list may not describe all possible side effects. Call your doctor for medical advice about side effects. You may report side effects to FDA at 1-800-FDA-1088. Where should I keep my medicine? Keep out of the reach of children. Store at room temperature between 20 and 25 degrees C (68 and 77 degrees F). Protect from light. Keep container tightly closed. Throw away any unused medicine after the expiration date. NOTE: This sheet is a summary. It may not cover all possible information. If you have questions about this medicine, talk to your doctor, pharmacist, or health care provider.  2018 Elsevier/Gold Standard (2015-07-18 10:08:00)

## 2017-02-23 NOTE — Progress Notes (Signed)
HPI:      [name] is a 56 y.o. G1P1001 who is postmenopausal, presents today for a problem visit.  She complains of vaginal dryness, severe dyspareunia.   Symptoms have been present for 2 years. Symptoms are mod to severe and has led her to come in today to seek options for intervention.  Previous Treatment: Replens, Lubricants, Premarin vag cream daily for about a year with no internal help or change in dyspareunia and did not like messy side effects  She is trying to be sexually active w husband of many years but has limited opportunities due to dryness and pain. Denies PostMenopausal Bleeding.  Had easy menopause transition with no bleeding or vasomotor concerns.  PAP UTD, normal. Recent STD testing done although low risk, neg.  PMHx: She  has a past medical history of Allergy; Blood transfusion; Blood transfusion without reported diagnosis; CHF (congestive heart failure) ([place]); Myocardial infarction [facility]) (2005); and Thyroid disease (2008). Also,  has a past surgical history that includes bilateral breast augmentation (1989); Cholecystectomy (2007); Tubal ligation (2004); Stapedectomy (Bilateral, 2000); and Cardiac catheterization (2005)., family history includes Alzheimer's disease in her father, paternal aunt, and paternal uncle; Breast cancer (age of onset: 59) in her maternal aunt and sister.,  reports that she has never smoked. She has never used smokeless tobacco. She reports that she drinks about 2.4 oz of alcohol per week . She reports that she does not use drugs.  She has a current medication list which includes the following prescription(s): acyclovir, aspirin, multiple vitamins-minerals, and ospemifene. Also, has No Known Allergies.  Review of Systems  Constitutional: Negative for chills, fever and malaise/fatigue.  HENT: Negative for congestion, sinus pain and sore throat.   Eyes: Negative for blurred vision and pain.  Respiratory: Negative for cough and wheezing.     Cardiovascular: Negative for chest pain and leg swelling.  Gastrointestinal: Negative for abdominal pain, constipation, diarrhea, heartburn, nausea and vomiting.  Genitourinary: Negative for dysuria, frequency, hematuria and urgency.  Musculoskeletal: Negative for back pain, joint pain, myalgias and neck pain.  Skin: Negative for itching and rash.  Neurological: Negative for dizziness, tremors and weakness.  Endo/Heme/Allergies: Does not bruise/bleed easily.  Psychiatric/Behavioral: Negative for depression. The patient is not nervous/anxious and does not have insomnia.     Objective: BP 120/80   Pulse 75   Ht 5\' 4"  (1.626 m)   Wt 144 lb (65.3 kg)   BMI 24.72 kg/m  Physical Exam  Constitutional: She is oriented to person, place, and time. She appears well-developed and well-nourished. No distress.  Genitourinary: Uterus normal. Pelvic exam was performed with patient supine. There is no rash, tenderness or lesion on the right labia. There is no rash, tenderness or lesion on the left labia. Vagina exhibits no lesion. There is tenderness in the vagina. No erythema or bleeding in the vagina. Vaginal discharge found. Right adnexum does not display mass and does not display tenderness. Left adnexum does not display mass and does not display tenderness. Cervix does not exhibit motion tenderness, discharge, polyp or nabothian cyst.   Uterus is mobile and midaxial. Uterus is not enlarged or exhibiting a mass.  Genitourinary Comments: Tender exam, atrophy present  Abdominal: Soft. She exhibits no distension. There is no tenderness.  Musculoskeletal: Normal range of motion.  Neurological: She is alert and oriented to person, place, and time. No cranial nerve deficit.  Skin: Skin is warm and dry.  Psychiatric: She has a normal mood and affect.  ASSESSMENT/PLAN:  Menopause. 1. Dyspareunia due to medical condition in female - Options d/w pt.  Hormones, Osphena, [place], Physocal Therapy,  Replens, Combination of these. As has failed hormonal therapy and lub/Replens, will start [place] and give adequate trial.  Pros and cons discussed.  3 mos f/u.  Consider PT/ dilator therapy if incomplete results.  Osphena Rx to [facility] to try to improve availability and cost.  2. Vaginal atrophy - NuSwab Vaginitis Plus (VG+) as adjunct to assess for BV, etc   Barnett Applebaum, MD, [facility], [facility] 02/23/2017  9:25 AM

## 2017-02-26 ENCOUNTER — Other Ambulatory Visit: Payer: Self-pay

## 2017-02-26 ENCOUNTER — Telehealth: Payer: Self-pay

## 2017-02-26 DIAGNOSIS — N941 Unspecified dyspareunia: Secondary | ICD-10-CM

## 2017-02-26 LAB — NUSWAB VAGINITIS PLUS (VG+)
ATOPOBIUM VAGINAE: HIGH {score} — AB
Candida albicans, NAA: NEGATIVE
Candida glabrata, NAA: NEGATIVE
Chlamydia trachomatis, NAA: NEGATIVE
Neisseria gonorrhoeae, NAA: NEGATIVE
Trich vag by NAA: NEGATIVE

## 2017-02-26 MED ORDER — OSPEMIFENE 60 MG PO TABS: 1.0000 | ORAL_TABLET | Freq: Every day | ORAL | 11 refills | Status: DC

## 2017-02-26 NOTE — Telephone Encounter (Signed)
Pt was given samples of osphenia - 5 tablets.  Is asking for more samples to tie her over until mail order comes.  Also would like rx changed to 90d supply instead of 30d d/t 30d is $40 and 90d is $80.

## 2017-02-26 NOTE — Telephone Encounter (Signed)
lmtrc

## 2017-03-01 ENCOUNTER — Other Ambulatory Visit: Payer: Self-pay | Admitting: Obstetrics & Gynecology

## 2017-03-01 DIAGNOSIS — N941 Unspecified dyspareunia: Secondary | ICD-10-CM

## 2017-03-01 MED ORDER — METRONIDAZOLE 0.75 % VA GEL: 1.0000 | Freq: Every day | VAGINAL | 0 refills | Status: AC

## 2017-03-01 MED ORDER — OSPEMIFENE 60 MG PO TABS: 1.0000 | ORAL_TABLET | Freq: Every day | ORAL | 3 refills | Status: DC

## 2017-05-05 ENCOUNTER — Encounter: Payer: Self-pay | Admitting: Primary Care

## 2017-05-26 ENCOUNTER — Encounter: Payer: Self-pay | Admitting: Obstetrics & Gynecology

## 2017-05-26 ENCOUNTER — Ambulatory Visit: Payer: BLUE CROSS/BLUE SHIELD | Admitting: Obstetrics & Gynecology

## 2017-05-26 VITALS — BP 112/70 | HR 72 | Ht 65.0 in | Wt 146.0 lb

## 2017-05-26 DIAGNOSIS — N952 Postmenopausal atrophic vaginitis: Secondary | ICD-10-CM | POA: Diagnosis not present

## 2017-05-26 DIAGNOSIS — N9419 Other specified dyspareunia: Secondary | ICD-10-CM | POA: Diagnosis not present

## 2017-05-26 NOTE — Patient Instructions (Signed)
REPLENS twice weekly

## 2017-05-26 NOTE — Progress Notes (Addendum)
History of Present Illness:  Mary Gill is a 56 y.o. who was started on  .  Ospemifene (OSPHENA) 60 MG TABS, Take 1 tablet by mouth daily., Disp: 90 tablet, Rfl: 3   approximately 3 months ago. Since that time, she states that her symptoms are improving.  Some irritation at times but much better.  PMHx: She  has a past medical history of Allergy, Blood transfusion, Blood transfusion without reported diagnosis, CHF (congestive heart failure) ([place]), Myocardial infarction ([place]) (2005), and Thyroid disease (2008). Also,  has a past surgical history that includes bilateral breast augmentation (1989); Cholecystectomy (2007); Tubal ligation (2004); Stapedectomy (Bilateral, 2000); and Cardiac catheterization (2005)., family history includes Alzheimer's disease in her father, paternal aunt, and paternal uncle; Breast cancer (age of onset: 69) in her maternal aunt and sister.,  reports that  has never smoked. she has never used smokeless tobacco. She reports that she drinks about 2.4 oz of alcohol per week. She reports that she does not use drugs.  Current Outpatient Medications:  .  acyclovir (ZOVIRAX) 400 MG tablet, Take 1 tablet three times daily for 5 days as needed for outbreaks., Disp: 15 tablet, Rfl: 1 .  aspirin 81 MG chewable tablet, Chew 1 tablet (81 mg total) by mouth daily., Disp: 90 tablet, Rfl: 3 .  Multiple Vitamins-Minerals (CENTRUM ADULTS PO), Take 1 tablet by mouth daily., Disp: , Rfl:  .  Ospemifene (OSPHENA) 60 MG TABS, Take 1 tablet by mouth daily., Disp: 90 tablet, Rfl: 3 Also, has No Known Allergies..  Review of Systems  All other systems reviewed and are negative.  Physical Exam:  BP 112/70   Pulse 72   Ht 5\' 5"  (1.651 m)   Wt 146 lb (66.2 kg)   BMI 24.30 kg/m  Body mass index is 24.3 kg/m. Physical Exam  Constitutional: She is oriented to person, place, and time. She appears well-developed and well-nourished. No distress.  Genitourinary: Vagina normal and uterus normal.  Pelvic exam was performed with patient supine. There is no rash, tenderness or lesion on the right labia. There is no rash, tenderness or lesion on the left labia. No erythema or bleeding in the vagina. Right adnexum does not display mass and does not display tenderness. Left adnexum does not display mass and does not display tenderness. Cervix does not exhibit motion tenderness, discharge, polyp or nabothian cyst.   Uterus is mobile and midaxial. Uterus is not enlarged or exhibiting a mass.  Genitourinary Comments: Mild atrophy present  Abdominal: Soft. She exhibits no distension. There is no tenderness.  Musculoskeletal: Normal range of motion.  Neurological: She is alert and oriented to person, place, and time. No cranial nerve deficit.  Skin: Skin is warm and dry.  Psychiatric: She has a normal mood and affect.   Assessment:  Problem List Items Addressed This Visit      Genitourinary   Vaginal atrophy - Primary   Relevant Orders   NuSwab Vaginitis (VG) Prior BV, test for cure as has been asymptomatic in past but also may contribute to dyspareunia     Other   Dyspareunia due to medical condition in female   Relevant Orders  Cont Osphena, add Replens   Medication treatment is going well for her vag dryness and pain.  Plan: She will undergo continue Osphena and add Replens; cont lubrication w sex in her medical therapy. No muscle pain or contractures so no need for PT at this time.  She was amenable to this plan  and we will see her back for annual/PRN.  Barnett Applebaum, MD, [facility], [facility] 05/26/2017  1:41 PM

## 2017-05-29 LAB — NUSWAB VAGINITIS (VG)
ATOPOBIUM VAGINAE: HIGH {score} — AB
Candida albicans, NAA: NEGATIVE
Candida glabrata, NAA: NEGATIVE
Trich vag by NAA: NEGATIVE

## 2017-05-30 NOTE — Progress Notes (Signed)
Let her know labs reveal no active bacterial infection. Continue her meds and add in the Replens to see if will help.

## 2017-05-31 ENCOUNTER — Telehealth: Payer: Self-pay | Admitting: Obstetrics & Gynecology

## 2017-05-31 NOTE — Telephone Encounter (Signed)
Patient called this morning stating she was returning your call.  Patient can be reached at [phone].

## 2017-07-23 DIAGNOSIS — Z1231 Encounter for screening mammogram for malignant neoplasm of breast: Secondary | ICD-10-CM | POA: Diagnosis not present

## 2017-07-23 DIAGNOSIS — Z803 Family history of malignant neoplasm of breast: Secondary | ICD-10-CM | POA: Diagnosis not present

## 2017-08-27 HISTORY — PX: CATARACT EXTRACTION, BILATERAL: SHX1313

## 2017-09-01 DIAGNOSIS — H25813 Combined forms of age-related cataract, bilateral: Secondary | ICD-10-CM | POA: Diagnosis not present

## 2017-09-07 DIAGNOSIS — H2510 Age-related nuclear cataract, unspecified eye: Secondary | ICD-10-CM | POA: Diagnosis not present

## 2017-09-07 DIAGNOSIS — I252 Old myocardial infarction: Secondary | ICD-10-CM | POA: Diagnosis not present

## 2017-09-07 DIAGNOSIS — H2511 Age-related nuclear cataract, right eye: Secondary | ICD-10-CM | POA: Diagnosis not present

## 2017-09-07 DIAGNOSIS — I2510 Atherosclerotic heart disease of native coronary artery without angina pectoris: Secondary | ICD-10-CM | POA: Diagnosis not present

## 2017-09-07 DIAGNOSIS — E049 Nontoxic goiter, unspecified: Secondary | ICD-10-CM | POA: Diagnosis not present

## 2017-09-07 DIAGNOSIS — H52221 Regular astigmatism, right eye: Secondary | ICD-10-CM | POA: Diagnosis not present

## 2017-09-07 DIAGNOSIS — H25811 Combined forms of age-related cataract, right eye: Secondary | ICD-10-CM | POA: Diagnosis not present

## 2017-09-14 DIAGNOSIS — Z961 Presence of intraocular lens: Secondary | ICD-10-CM | POA: Diagnosis not present

## 2017-09-14 DIAGNOSIS — H52222 Regular astigmatism, left eye: Secondary | ICD-10-CM | POA: Diagnosis not present

## 2017-09-14 DIAGNOSIS — H2510 Age-related nuclear cataract, unspecified eye: Secondary | ICD-10-CM | POA: Diagnosis not present

## 2017-09-14 DIAGNOSIS — H25812 Combined forms of age-related cataract, left eye: Secondary | ICD-10-CM | POA: Diagnosis not present

## 2017-09-14 DIAGNOSIS — Z9841 Cataract extraction status, right eye: Secondary | ICD-10-CM | POA: Diagnosis not present

## 2017-09-14 DIAGNOSIS — H2512 Age-related nuclear cataract, left eye: Secondary | ICD-10-CM | POA: Diagnosis not present

## 2018-01-11 DIAGNOSIS — H524 Presbyopia: Secondary | ICD-10-CM | POA: Diagnosis not present

## 2018-01-11 DIAGNOSIS — H43812 Vitreous degeneration, left eye: Secondary | ICD-10-CM | POA: Diagnosis not present

## 2018-01-11 DIAGNOSIS — Z961 Presence of intraocular lens: Secondary | ICD-10-CM | POA: Diagnosis not present

## 2018-01-11 DIAGNOSIS — H3540 Unspecified peripheral retinal degeneration: Secondary | ICD-10-CM | POA: Diagnosis not present

## 2018-03-15 DIAGNOSIS — H903 Sensorineural hearing loss, bilateral: Secondary | ICD-10-CM | POA: Diagnosis not present

## 2018-04-20 ENCOUNTER — Ambulatory Visit (INDEPENDENT_AMBULATORY_CARE_PROVIDER_SITE_OTHER): Payer: BLUE CROSS/BLUE SHIELD | Admitting: Primary Care

## 2018-04-20 ENCOUNTER — Encounter: Payer: Self-pay | Admitting: Primary Care

## 2018-04-20 VITALS — BP 108/68 | HR 69 | Temp 97.8°F | Ht 65.0 in | Wt 135.8 lb

## 2018-04-20 DIAGNOSIS — I252 Old myocardial infarction: Secondary | ICD-10-CM | POA: Diagnosis not present

## 2018-04-20 DIAGNOSIS — Z23 Encounter for immunization: Secondary | ICD-10-CM | POA: Diagnosis not present

## 2018-04-20 DIAGNOSIS — Z0001 Encounter for general adult medical examination with abnormal findings: Secondary | ICD-10-CM | POA: Insufficient documentation

## 2018-04-20 DIAGNOSIS — Z0000 Encounter for general adult medical examination without abnormal findings: Secondary | ICD-10-CM | POA: Insufficient documentation

## 2018-04-20 DIAGNOSIS — Z1159 Encounter for screening for other viral diseases: Secondary | ICD-10-CM | POA: Diagnosis not present

## 2018-04-20 DIAGNOSIS — A6000 Herpesviral infection of urogenital system, unspecified: Secondary | ICD-10-CM

## 2018-04-20 DIAGNOSIS — E2839 Other primary ovarian failure: Secondary | ICD-10-CM

## 2018-04-20 LAB — COMPREHENSIVE METABOLIC PANEL
ALBUMIN: 4.2 g/dL (ref 3.5–5.2)
ALT: 12 U/L (ref 0–35)
AST: 15 U/L (ref 0–37)
Alkaline Phosphatase: 73 U/L (ref 39–117)
BUN: 22 mg/dL (ref 6–23)
CALCIUM: 9.6 mg/dL (ref 8.4–10.5)
CHLORIDE: 104 meq/L (ref 96–112)
CO2: 28 mEq/L (ref 19–32)
CREATININE: 0.84 mg/dL (ref 0.40–1.20)
GFR: 73.7 mL/min (ref >60.00)
Glucose, Bld: 88 mg/dL (ref 70–99)
POTASSIUM: 4.7 meq/L (ref 3.5–5.1)
Sodium: 139 mEq/L (ref 135–145)
Total Bilirubin: 0.5 mg/dL (ref 0.2–1.2)
Total Protein: 7.1 g/dL (ref 6.0–8.3)

## 2018-04-20 LAB — LIPID PANEL
CHOLESTEROL: 187 mg/dL (ref 0–200)
HDL: 105.1 mg/dL (ref >39.00)
LDL CALC: 76 mg/dL (ref 0–99)
NonHDL: 82.28
TRIGLYCERIDES: 32 mg/dL (ref 0.0–149.0)
Total CHOL/HDL Ratio: 2
VLDL: 6.4 mg/dL (ref 0.0–40.0)

## 2018-04-20 MED ORDER — ACYCLOVIR 400 MG PO TABS: ORAL_TABLET | ORAL | 0 refills | Status: DC

## 2018-04-20 NOTE — Progress Notes (Signed)
Subjective:    Patient ID: Mary Gill, female    DOB: 1961-03-05, 57 y.o.   MRN: [id number]  HPI  Mary Gill is a 57 year old female who presents today for complete physical.  Immunizations: -Tetanus: Unsure, believes it's been over 10 years.  -Influenza: Due today  Diet: She endorses a healthy diet Breakfast: Egg muffins, fruit Lunch: Protein, sweet potato, vegetables  Dinner: Protein, starch, vegetables, soup Snacks: Cottage cheese, fruit, nuts Desserts: 3-4 days weekly  Beverages: Coffee, protein shake, water  Exercise: She is active in boot camp 5 days weekly Eye exam: Completed in 2019 Dental exam: Completes semi-annually  Colonoscopy: Completed in 2015 Pap Smear: Completed in 2018 Mammogram: Due in January 2019 Hepatitis C Screening: Due  Review of Systems  Constitutional: Negative for unexpected weight change.  HENT: Negative for rhinorrhea.   Respiratory: Negative for cough and shortness of breath.   Cardiovascular: Negative for chest pain.  Gastrointestinal: Negative for constipation and diarrhea.  Genitourinary: Negative for difficulty urinating and menstrual problem.  Musculoskeletal: Negative for arthralgias and myalgias.  Skin: Negative for rash.  Allergic/Immunologic: Negative for environmental allergies.  Neurological: Negative for dizziness, numbness and headaches.  Psychiatric/Behavioral: The patient is not nervous/anxious.        Past Medical History:  Diagnosis Date  . Allergy   . Blood transfusion    2005 after heart cath  . Blood transfusion without reported diagnosis   . CHF (congestive heart failure) ([place])   . Myocardial infarction ([place]) 2005  . Thyroid disease 2008   goiter     Social History   Socioeconomic History  . Marital status: Married    Spouse name: Not on file  . Number of children: Not on file  . Years of education: Not on file  . Highest education level: Not on file  Occupational History  . Not on file  Social  Needs  . Financial resource strain: Not on file  . Food insecurity:    Worry: Not on file    Inability: Not on file  . Transportation needs:    Medical: Not on file    Non-medical: Not on file  Tobacco Use  . Smoking status: Never Smoker  . Smokeless tobacco: Never Used  Substance and Sexual Activity  . Alcohol use: Yes    Alcohol/week: 4.0 standard drinks    Types: 4 Glasses of wine per week  . Drug use: No  . Sexual activity: Yes    Partners: Male    Birth control/protection: Post-menopausal  Lifestyle  . Physical activity:    Days per week: Not on file    Minutes per session: Not on file  . Stress: Not on file  Relationships  . Social connections:    Talks on phone: Not on file    Gets together: Not on file    Attends religious service: Not on file    Active member of club or organization: Not on file    Attends meetings of clubs or organizations: Not on file    Relationship status: Not on file  . Intimate partner violence:    Fear of current or ex partner: Not on file    Emotionally abused: Not on file    Physically abused: Not on file    Forced sexual activity: Not on file  Other Topics Concern  . Not on file  Social History Narrative   Married.   1 child. 1 grandchild.   Works at [employer].  Enjoys golfing, exercising, spending with friends.     Past Surgical History:  Procedure Laterality Date  . bilateral breast augmentation  1989  . CARDIAC CATHETERIZATION  2005   after MI; bleed at point of entry; received 3 units blood  . CATARACT EXTRACTION, BILATERAL  08/2017  . CHOLECYSTECTOMY  2007  . STAPEDECTOMY Bilateral 2000  . TUBAL LIGATION  2004    Family History  Problem Relation Age of Onset  . Alzheimer's disease Father   . Breast cancer Maternal Aunt 26  . Alzheimer's disease Paternal Aunt   . Alzheimer's disease Paternal Uncle   . Breast cancer Sister 34       07/2014  . Colon cancer Neg Hx   . Esophageal cancer Neg Hx   . Stomach  cancer Neg Hx   . Rectal cancer Neg Hx     No Known Allergies  Current Outpatient Medications on File Prior to Visit  Medication Sig Dispense Refill  . acyclovir (ZOVIRAX) 400 MG tablet Take 1 tablet three times daily for 5 days as needed for outbreaks. 15 tablet 1   No current facility-administered medications on file prior to visit.     BP 108/68   Pulse 69   Temp 97.8 F (36.6 C) (Oral)   Ht 5\' 5"  (1.651 m)   Wt 135 lb 12 oz (61.6 kg)   SpO2 98%   BMI 22.59 kg/m    Objective:   Physical Exam  Constitutional: She is oriented to person, place, and time. She appears well-nourished.  HENT:  Mouth/Throat: No oropharyngeal exudate.  Eyes: Pupils are equal, round, and reactive to light. EOM are normal.  Neck: Neck supple. No thyromegaly present.  Cardiovascular: Normal rate and regular rhythm.  Respiratory: Effort normal and breath sounds normal.  GI: Soft. Bowel sounds are normal. There is no tenderness.  Musculoskeletal: Normal range of motion.  Neurological: She is alert and oriented to person, place, and time.  Skin: Skin is warm and dry.  Psychiatric: She has a normal mood and affect.           Assessment & Plan:

## 2018-04-20 NOTE — Addendum Note (Signed)
Addended by: Jacqualin Combes on: 04/20/2018 02:21 PM   Modules accepted: Orders

## 2018-04-20 NOTE — Assessment & Plan Note (Signed)
Td and influenza vaccinations due and provided today. Pap smear UTD.  Mammogram due in January 2019. Patient requesting bone density testing, orders placed. Commended her on a healthy lifestyle, encouraged to continue.  Exam unremarkable. Labs pending. Follow up in 1 year for CPE.

## 2018-04-20 NOTE — Patient Instructions (Addendum)
Stop by the lab prior to leaving today. I will notify you of your results once received.   Continue exercising. You should be getting 150 minutes of moderate intensity exercise weekly.  Continue to work on a healthy diet. Increase vegetables, fruit, whole grains, lean protein.  Ensure you are consuming 64 ounces of water daily.  [facility] to schedule your bone density testing.   We will see you in one year for your annual exam or sooner if needed.   It was a pleasure to see you today!   Preventive Care 40-64 Years, Female Preventive care refers to lifestyle choices and visits with your health care provider that can promote health and wellness. What does preventive care include?  A yearly physical exam. This is also called an annual well check.  Dental exams once or twice a year.  Routine eye exams. Ask your health care provider how often you should have your eyes checked.  Personal lifestyle choices, including: ? Daily care of your teeth and gums. ? Regular physical activity. ? Eating a healthy diet. ? Avoiding tobacco and drug use. ? Limiting alcohol use. ? Practicing safe sex. ? Taking low-dose aspirin daily starting at age 44. ? Taking vitamin and mineral supplements as recommended by your health care provider. What happens during an annual well check? The services and screenings done by your health care provider during your annual well check will depend on your age, overall health, lifestyle risk factors, and family history of disease. Counseling Your health care provider may ask you questions about your:  Alcohol use.  Tobacco use.  Drug use.  Emotional well-being.  Home and relationship well-being.  Sexual activity.  Eating habits.  Work and work Statistician.  Method of birth control.  Menstrual cycle.  Pregnancy history.  Screening You may have the following tests or measurements:  Height, weight, and BMI.  Blood pressure.  Lipid  and cholesterol levels. These may be checked every 5 years, or more frequently if you are over 71 years old.  Skin check.  Lung cancer screening. You may have this screening every year starting at age 31 if you have a 30-pack-year history of smoking and currently smoke or have quit within the past 15 years.  Fecal occult blood test (FOBT) of the stool. You may have this test every year starting at age 56.  Flexible sigmoidoscopy or colonoscopy. You may have a sigmoidoscopy every 5 years or a colonoscopy every 10 years starting at age 17.  Hepatitis C blood test.  Hepatitis B blood test.  Sexually transmitted disease (STD) testing.  Diabetes screening. This is done by checking your blood sugar (glucose) after you have not eaten for a while (fasting). You may have this done every 1-3 years.  Mammogram. This may be done every 1-2 years. Talk to your health care provider about when you should start having regular mammograms. This may depend on whether you have a family history of breast cancer.  BRCA-related cancer screening. This may be done if you have a family history of breast, ovarian, tubal, or peritoneal cancers.  Pelvic exam and Pap test. This may be done every 3 years starting at age 14. Starting at age 18, this may be done every 5 years if you have a Pap test in combination with an HPV test.  Bone density scan. This is done to screen for osteoporosis. You may have this scan if you are at high risk for osteoporosis.  Discuss your test  results, treatment options, and if necessary, the need for more tests with your health care provider. Vaccines Your health care provider may recommend certain vaccines, such as:  Influenza vaccine. This is recommended every year.  Tetanus, diphtheria, and acellular pertussis (Tdap, Td) vaccine. You may need a Td booster every 10 years.  Varicella vaccine. You may need this if you have not been vaccinated.  Zoster vaccine. You may need this after  age 67.  Measles, mumps, and rubella (MMR) vaccine. You may need at least one dose of MMR if you were born in 1957 or later. You may also need a second dose.  Pneumococcal 13-valent conjugate (PCV13) vaccine. You may need this if you have certain conditions and were not previously vaccinated.  Pneumococcal polysaccharide (PPSV23) vaccine. You may need one or two doses if you smoke cigarettes or if you have certain conditions.  Meningococcal vaccine. You may need this if you have certain conditions.  Hepatitis A vaccine. You may need this if you have certain conditions or if you travel or work in places where you may be exposed to hepatitis A.  Hepatitis B vaccine. You may need this if you have certain conditions or if you travel or work in places where you may be exposed to hepatitis B.  Haemophilus influenzae type b (Hib) vaccine. You may need this if you have certain conditions.  Talk to your health care provider about which screenings and vaccines you need and how often you need them. This information is not intended to replace advice given to you by your health care provider. Make sure you discuss any questions you have with your health care provider. Document Released: 07/12/2015 Document Revised: 03/04/2016 Document Reviewed: 04/16/2015 Elsevier Interactive Patient Education  [employer].

## 2018-04-20 NOTE — Assessment & Plan Note (Signed)
Asymptomatic.  Continue to monitor

## 2018-04-20 NOTE — Assessment & Plan Note (Signed)
No recent outbreaks, one outbreak since last visit one year ago.

## 2018-04-21 LAB — HEPATITIS C ANTIBODY
Hepatitis C Ab: NONREACTIVE
SIGNAL TO CUT-OFF: 0.14 (ref <1.00)

## 2018-04-26 ENCOUNTER — Telehealth: Payer: Self-pay | Admitting: Primary Care

## 2018-04-26 NOTE — Telephone Encounter (Signed)
Left message asking pt to call office please transfer to [place] @ [facility] Regarding bone density

## 2018-05-12 DIAGNOSIS — Z780 Asymptomatic menopausal state: Secondary | ICD-10-CM | POA: Diagnosis not present

## 2018-05-12 DIAGNOSIS — Z8262 Family history of osteoporosis: Secondary | ICD-10-CM | POA: Diagnosis not present

## 2018-05-12 DIAGNOSIS — M8589 Other specified disorders of bone density and structure, multiple sites: Secondary | ICD-10-CM | POA: Diagnosis not present

## 2018-05-12 LAB — HM DEXA SCAN

## 2018-05-16 ENCOUNTER — Telehealth: Payer: Self-pay | Admitting: Primary Care

## 2018-05-16 ENCOUNTER — Encounter: Payer: Self-pay | Admitting: Primary Care

## 2018-05-16 NOTE — Telephone Encounter (Signed)
Message left for patient to return my call.  

## 2018-05-16 NOTE — Telephone Encounter (Signed)
Please notify patient:  Your bone density test shows osteopenia which is the precursor to osteoporosis. If not already doing so, I recommend you start taking Calcium 1200 mg with Vitamin D 800 units everyday. If already doing so then please notify me.   We will repeat this test in 2 years.

## 2018-05-18 NOTE — Telephone Encounter (Signed)
Send patient a message through MyChart. 

## 2018-05-18 NOTE — Telephone Encounter (Signed)
Message left for patient to return my call.  

## 2018-05-24 NOTE — Telephone Encounter (Signed)
Please advise to the message below  From: Chauncey Cruel Murtha "Mary Gill"  Created: 05/20/2018 11:19 PM   *-*-*This message has not been handled.*-*-*  Ok I will start right away.What was my T score?I go to boot camp for exercise, should I limit the amount of jumping that I do?

## 2018-05-24 NOTE — Telephone Encounter (Signed)
Noted and responded via my chart.

## 2018-07-21 DIAGNOSIS — Z1231 Encounter for screening mammogram for malignant neoplasm of breast: Secondary | ICD-10-CM | POA: Diagnosis not present

## 2018-07-21 DIAGNOSIS — Z803 Family history of malignant neoplasm of breast: Secondary | ICD-10-CM | POA: Diagnosis not present

## 2018-07-21 LAB — HM MAMMOGRAPHY

## 2018-07-25 ENCOUNTER — Encounter: Payer: Self-pay | Admitting: Primary Care

## 2019-01-19 ENCOUNTER — Encounter: Payer: Self-pay | Admitting: Gastroenterology

## 2019-05-31 DIAGNOSIS — M25541 Pain in joints of right hand: Secondary | ICD-10-CM | POA: Diagnosis not present

## 2019-05-31 DIAGNOSIS — M1811 Unilateral primary osteoarthritis of first carpometacarpal joint, right hand: Secondary | ICD-10-CM | POA: Diagnosis not present

## 2019-06-09 ENCOUNTER — Other Ambulatory Visit: Payer: Self-pay

## 2019-06-09 DIAGNOSIS — A6000 Herpesviral infection of urogenital system, unspecified: Secondary | ICD-10-CM

## 2019-06-09 NOTE — Telephone Encounter (Signed)
Patient has not been seen in over one year.  Needs office or virtual visit for refills.

## 2019-06-09 NOTE — Telephone Encounter (Signed)
There is MyChart message regarding this

## 2019-06-09 NOTE — Telephone Encounter (Signed)
Last prescribed on 04/20/2018 . Last appointment on 04/20/2018. No future appointment

## 2019-06-10 ENCOUNTER — Other Ambulatory Visit: Payer: Self-pay

## 2019-06-10 DIAGNOSIS — A6000 Herpesviral infection of urogenital system, unspecified: Secondary | ICD-10-CM

## 2019-06-13 NOTE — Telephone Encounter (Signed)
Pt is scheduled for virtual appt on Friday. She is wondering if she can get more than 15 pills. I advised her to discuss this with you during her visit.

## 2019-06-13 NOTE — Telephone Encounter (Signed)
If she completely out?  The reason I only prescribe a few at a time so I can monitor outbreak frequency.  If she is having frequent outbreaks then treatment is different.  Can she wait untill Friday?

## 2019-06-14 NOTE — Telephone Encounter (Signed)
Message left for patient to return my call.  

## 2019-06-16 ENCOUNTER — Encounter: Payer: Self-pay | Admitting: Primary Care

## 2019-06-16 ENCOUNTER — Other Ambulatory Visit: Payer: Self-pay

## 2019-06-16 ENCOUNTER — Telehealth (INDEPENDENT_AMBULATORY_CARE_PROVIDER_SITE_OTHER): Payer: BC Managed Care – PPO | Admitting: Primary Care

## 2019-06-16 DIAGNOSIS — A6000 Herpesviral infection of urogenital system, unspecified: Secondary | ICD-10-CM

## 2019-06-16 MED ORDER — ACYCLOVIR 400 MG PO TABS: ORAL_TABLET | ORAL | 0 refills | Status: DC

## 2019-06-16 MED ORDER — ACYCLOVIR 5 % EX OINT: 1.0000 "application " | TOPICAL_OINTMENT | CUTANEOUS | 0 refills | Status: DC

## 2019-06-16 NOTE — Progress Notes (Signed)
Subjective:    Patient ID: Mary Gill, female    DOB: [birth year], 58 y.o.   MRN: [id number]  HPI  Virtual Visit via Video Note  I connected with Mary Gill on 06/16/19 at  8:20 AM EST by a video enabled telemedicine application and verified that I am speaking with the correct person using two identifiers.  Location: Patient: Home Provider: Office   I discussed the limitations of evaluation and management by telemedicine and the availability of in person appointments. The patient expressed understanding and agreed to proceed.  History of Present Illness:  Mary Gill is a 58 year old female with a history of vaginal atrophy, genital herpes, CAD who presents today for follow up. She is overdue for her CPE, will plan to schedule this for Summer 2021 as her pap smear is due.  She is currently managed on acyclovir 400 mg for which she takes as needed for breakouts. Breakouts occur once every three months on average, thinks they are stress induced. She will take her acyclovir daily for about five days with resolve. She is requesting to also have the acyclovir ointment for soothing purposes.   She is scheduled for her mammogram early 2021.   Observations/Objective:  Alert and oriented. Appears well, not sickly. No distress. Speaking in complete sentences.   Assessment and Plan:  See problem based charting.  Follow Up Instructions:  Please notify me if you have more than 3-4 breakouts annually.   We will see you in Summer 2021 for your physical and pap smear.  It was a pleasure to see you today! Mary Bossier, NP-C    I discussed the assessment and treatment plan with the patient. The patient was provided an opportunity to ask questions and all were answered. The patient agreed with the plan and demonstrated an understanding of the instructions.   The patient was advised to call back or seek an in-person evaluation if the symptoms worsen or if the condition fails to  improve as anticipated.   Mary Koch, NP    Review of Systems  Constitutional: Negative for fever.  Respiratory: Negative for shortness of breath.   Cardiovascular: Negative for chest pain.  Genitourinary:       Genital herpes outbreaks every 3 months       Past Medical History:  Diagnosis Date  . Allergy   . Blood transfusion    2005 after heart cath  . Blood transfusion without reported diagnosis   . CHF (congestive heart failure) ([place])   . Myocardial infarction ([place]) 2005  . Thyroid disease 2008   goiter     Social History   Socioeconomic History  . Marital status: Married    Spouse name: Not on file  . Number of children: Not on file  . Years of education: Not on file  . Highest education level: Not on file  Occupational History  . Not on file  Tobacco Use  . Smoking status: Never Smoker  . Smokeless tobacco: Never Used  Substance and Sexual Activity  . Alcohol use: Yes    Alcohol/week: 4.0 standard drinks    Types: 4 Glasses of wine per week  . Drug use: No  . Sexual activity: Yes    Partners: Male    Birth control/protection: Post-menopausal  Other Topics Concern  . Not on file  Social History Narrative   Married.   1 child. 1 grandchild.   Works at [employer].   Enjoys golfing, exercising, spending with  friends.    Social Determinants of Health   Financial Resource Strain:   . Difficulty of Paying Living Expenses: Not on file  Food Insecurity:   . Worried About Charity fundraiser in the Last Year: Not on file  . Ran Out of Food in the Last Year: Not on file  Transportation Needs:   . Lack of Transportation (Medical): Not on file  . Lack of Transportation (Non-Medical): Not on file  Physical Activity:   . Days of Exercise per Week: Not on file  . Minutes of Exercise per Session: Not on file  Stress:   . Feeling of Stress : Not on file  Social Connections:   . Frequency of Communication with Friends and Family: Not on file  .  Frequency of Social Gatherings with Friends and Family: Not on file  . Attends Religious Services: Not on file  . Active Member of Clubs or Organizations: Not on file  . Attends Archivist Meetings: Not on file  . Marital Status: Not on file  Intimate Partner Violence:   . Fear of Current or Ex-Partner: Not on file  . Emotionally Abused: Not on file  . Physically Abused: Not on file  . Sexually Abused: Not on file    Past Surgical History:  Procedure Laterality Date  . bilateral breast augmentation  1989  . CARDIAC CATHETERIZATION  2005   after MI; bleed at point of entry; received 3 units blood  . CATARACT EXTRACTION, BILATERAL  08/2017  . CHOLECYSTECTOMY  2007  . STAPEDECTOMY Bilateral 2000  . TUBAL LIGATION  2004    Family History  Problem Relation Age of Onset  . Alzheimer's disease Father   . Breast cancer Maternal Aunt 18  . Alzheimer's disease Paternal Aunt   . Alzheimer's disease Paternal Uncle   . Breast cancer Sister 36       07/2014  . Colon cancer Neg Hx   . Esophageal cancer Neg Hx   . Stomach cancer Neg Hx   . Rectal cancer Neg Hx     No Known Allergies  Current Outpatient Medications on File Prior to Visit  Medication Sig Dispense Refill  . acyclovir (ZOVIRAX) 400 MG tablet Take 1 tablet three times daily for 5 days as needed for outbreaks. 15 tablet 0   No current facility-administered medications on file prior to visit.    There were no vitals taken for this visit.   Objective:   Physical Exam  Constitutional: She is oriented to person, place, and time. She appears well-nourished.  Respiratory: Effort normal.  Neurological: She is alert and oriented to person, place, and time.  Psychiatric: She has a normal mood and affect.           Assessment & Plan:

## 2019-06-16 NOTE — Patient Instructions (Signed)
Please notify me if you have more than 3-4 breakouts annually.   We will see you in Summer 2021 for your physical and pap smear.  It was a pleasure to see you today! Allie Bossier, NP-C

## 2019-06-16 NOTE — Assessment & Plan Note (Signed)
Breakouts every 3 months, no more often that that. Doing well on acyclovir tablets, refills provided. Rx for the ointment provided per patient request, she has done well on this in the past.

## 2019-07-26 DIAGNOSIS — Z1231 Encounter for screening mammogram for malignant neoplasm of breast: Secondary | ICD-10-CM | POA: Diagnosis not present

## 2019-07-26 LAB — HM MAMMOGRAPHY

## 2019-08-01 ENCOUNTER — Encounter: Payer: Self-pay | Admitting: Primary Care

## 2019-08-28 DIAGNOSIS — H26493 Other secondary cataract, bilateral: Secondary | ICD-10-CM | POA: Diagnosis not present

## 2019-09-07 DIAGNOSIS — H26492 Other secondary cataract, left eye: Secondary | ICD-10-CM | POA: Diagnosis not present

## 2020-02-05 NOTE — Telephone Encounter (Signed)
Patient called stating that she sent a mychart message to Allie Bossier NP and was advised that she is out of the office. Patient stated that she was bitten by a tickon her face about 3 weeks ago and stung by 4 yellow jackets a week ago. Patient stated that she has had some swelling in her face. Patient stated that she has been taking Benadryl and it seems to help calm her symptoms down. Patient denies any SOB or difficulty breathing. Patient scheduled for an office visit with Dr. Damita Dunnings 02/06/20 at 2:00 pm. Patient was given ER precautions and she verbalized understanding. Patient was advised to continue the benadryl if that seems to help.

## 2020-02-06 ENCOUNTER — Encounter: Payer: Self-pay | Admitting: Family Medicine

## 2020-02-06 ENCOUNTER — Ambulatory Visit (INDEPENDENT_AMBULATORY_CARE_PROVIDER_SITE_OTHER): Payer: BC Managed Care – PPO | Admitting: Family Medicine

## 2020-02-06 ENCOUNTER — Other Ambulatory Visit: Payer: Self-pay

## 2020-02-06 DIAGNOSIS — R220 Localized swelling, mass and lump, head: Secondary | ICD-10-CM

## 2020-02-06 MED ORDER — LORATADINE 10 MG PO TABS: 10.0000 mg | ORAL_TABLET | Freq: Every day | ORAL | Status: DC

## 2020-02-06 MED ORDER — FAMOTIDINE 20 MG PO TABS: 20.0000 mg | ORAL_TABLET | Freq: Every day | ORAL | Status: DC

## 2020-02-06 MED ORDER — EPINEPHRINE 0.3 MG/0.3ML IJ SOAJ: 0.3000 mg | INTRAMUSCULAR | 0 refills | Status: DC | PRN

## 2020-02-06 MED ORDER — PREDNISONE 20 MG PO TABS: ORAL_TABLET | ORAL | 0 refills | Status: DC

## 2020-02-06 NOTE — Patient Instructions (Signed)
I would avoid mammalian meat for now.  Add on claritin and pepcid.  Use benadryl if needed.  If more symptoms, then start prednisone.  If short of breath, then use epipen and dial 911.   Let us know if you want to go to the allergy clinic.  Take care.  Glad to see you.

## 2020-02-06 NOTE — Telephone Encounter (Signed)
Noted.  We will see at office visit.  Thanks.

## 2020-02-06 NOTE — Progress Notes (Signed)
This visit occurred during the SARS-CoV-2 public health emergency.  Safety protocols were in place, including screening questions prior to the visit, additional usage of staff PPE, and extensive cleaning of exam room while observing appropriate contact time as indicated for disinfecting solutions.  Sx started about 3 weeks ago.  She had tick bite on the L upper eyedlid.  1 week ago with mult yellow jacket bites.  Then 2 days ago woke up with swelling near the eyes and L ear.  Yesterday AM with upper lip swelling and "splotchy" redness on the face.  No lower lip swelling.  No ACE use.  No new foods or soaps but did have new laundry detergent.  Itching and prev used benadryl.  Mild redness on the R upper thorax and it resolved.  No other skin lesions.    No FCNAVD.  No sx like this prev except for local reactions to bug bites- no prev systemic sx.  No tongue swelling. No wheeze or speech changes.  She doesn't eat red meat.  She usually eats tofu or chicken.  She is still itchy on the legs now, minimally near the L orbit.    Meds, vitals, and allergies reviewed.   ROS: Per HPI unless specifically indicated in ROS section   nad ncat No lip or tongue swelling. Neck supple.  No lymphadenopathy.  No stridor rrr ctab No wheeze.  No rash but L knee excoriated at previous bite site.

## 2020-02-07 DIAGNOSIS — R220 Localized swelling, mass and lump, head: Secondary | ICD-10-CM | POA: Insufficient documentation

## 2020-02-07 NOTE — Assessment & Plan Note (Addendum)
Resolved now.  Unclear source.  Possible alpha gal discussed with patient, given previous tick bite.  Discussed options.  She declined alpha gal testing at this point.  I would avoid mammalian meat for now.  Add on claritin and pepcid.  Use benadryl if needed.  If more symptoms, then start prednisone.  Steroid cautions discussed with patient. If short of breath, then use epipen and dial 911.  Routine EpiPen cautions discussed with patient  She will let us know if she wants to go to the allergy clinic.   Okay for outpatient follow-up.

## 2020-04-09 DIAGNOSIS — Z20822 Contact with and (suspected) exposure to covid-19: Secondary | ICD-10-CM | POA: Diagnosis not present

## 2020-04-09 DIAGNOSIS — Z03818 Encounter for observation for suspected exposure to other biological agents ruled out: Secondary | ICD-10-CM | POA: Diagnosis not present

## 2020-06-26 ENCOUNTER — Ambulatory Visit (INDEPENDENT_AMBULATORY_CARE_PROVIDER_SITE_OTHER): Payer: BC Managed Care – PPO | Admitting: Family Medicine

## 2020-06-26 ENCOUNTER — Other Ambulatory Visit: Payer: Self-pay

## 2020-06-26 ENCOUNTER — Encounter: Payer: Self-pay | Admitting: Family Medicine

## 2020-06-26 VITALS — BP 140/84 | HR 73 | Temp 97.9°F | Ht 65.0 in | Wt 147.8 lb

## 2020-06-26 DIAGNOSIS — N3090 Cystitis, unspecified without hematuria: Secondary | ICD-10-CM | POA: Diagnosis not present

## 2020-06-26 DIAGNOSIS — R300 Dysuria: Secondary | ICD-10-CM

## 2020-06-26 LAB — POC URINALSYSI DIPSTICK (AUTOMATED)
Bilirubin, UA: NEGATIVE
Glucose, UA: NEGATIVE
Ketones, UA: NEGATIVE
Nitrite, UA: NEGATIVE
Protein, UA: NEGATIVE
Spec Grav, UA: 1.015 (ref 1.010–1.025)
Urobilinogen, UA: 0.2 E.U./dL
pH, UA: 7.5 (ref 5.0–8.0)

## 2020-06-26 MED ORDER — NITROFURANTOIN MONOHYD MACRO 100 MG PO CAPS
100.0000 mg | ORAL_CAPSULE | Freq: Two times a day (BID) | ORAL | 0 refills | Status: AC
Start: 2020-06-26 — End: 2020-07-03

## 2020-06-26 NOTE — Progress Notes (Signed)
Averey Koning T. Lesleyann Fichter, MD, CAQ Sports Medicine  Primary Care and Sports Medicine [facility] at [facility] [address] [place], [address]  Phone: [phone]  FAX: [phone]  Mary Gill - 59 y.o. female  MRN [id number]  Date of Birth: 01-29-1961  Date: 06/26/2020  PCP: Doreene Nest, NP  Referral: Doreene Nest, NP  Chief Complaint  Patient presents with  . Dysuria    This visit occurred during the SARS-CoV-2 public health emergency.  Safety protocols were in place, including screening questions prior to the visit, additional usage of staff PPE, and extensive cleaning of exam room while observing appropriate contact time as indicated for disinfecting solutions.   Subjective:   Mary Gill is a 59 y.o. very pleasant female patient with Body mass index is 24.59 kg/m. who presents with the following:  Pain with urination. Home UA. Did take some AZO.  She stopped it over the weekend. She does not have any urgency, but she does have some pain with urination.  She does not routinely get urinary tract infections, but she has had them before.  It has been approximately 10 years prior.  Large Leuks, large blood.  Review of Systems is noted in the HPI, as appropriate  Objective:   BP 140/84   Pulse 73   Temp 97.9 F (36.6 C) (Temporal)   Ht 5\' 5"  (1.651 m)   Wt 147 lb 12 oz (67 kg)   SpO2 92%   BMI 24.59 kg/m   GEN: No acute distress; alert,appropriate. PULM: Breathing comfortably in no respiratory distress PSYCH: Normally interactive.  No hypogastric pain No CVAT  Laboratory and Imaging Data: Results for orders placed or performed in visit on 06/26/20  POCT Urinalysis Dipstick (Automated)  Result Value Ref Range   Color, UA Yellow    Clarity, UA Hazy    Glucose, UA Negative Negative   Bilirubin, UA Negative    Ketones, UA Negative    Spec Grav, UA 1.015 1.010 - 1.025   Blood, UA Large    pH, UA 7.5 5.0 - 8.0    Protein, UA Negative Negative   Urobilinogen, UA 0.2 0.2 or 1.0 E.U./dL   Nitrite, UA Negative    Leukocytes, UA Large (3+) (A) Negative     Assessment and Plan:     ICD-10-CM   1. Cystitis  N30.90   2. Dysuria  R30.0 POCT Urinalysis Dipstick (Automated)    Urine Culture   This appears to be simple cystitis, and anticipate that she would do well.  Meds ordered this encounter  Medications  . nitrofurantoin, macrocrystal-monohydrate, (MACROBID) 100 MG capsule    Sig: Take 1 capsule (100 mg total) by mouth 2 (two) times daily for 7 days.    Dispense:  14 capsule    Refill:  0   Medications Discontinued During This Encounter  Medication Reason  . predniSONE (DELTASONE) 20 MG tablet Completed Course  . EPINEPHrine 0.3 mg/0.3 mL IJ SOAJ injection Completed Course  . famotidine (PEPCID) 20 MG tablet Completed Course  . loratadine (CLARITIN) 10 MG tablet Completed Course   Orders Placed This Encounter  Procedures  . Urine Culture  . POCT Urinalysis Dipstick (Automated)    Follow-up: No follow-ups on file.  Signed,  06/28/20. Rebbecca Osuna, MD   Outpatient Encounter Medications as of 06/26/2020  Medication Sig  . acyclovir (ZOVIRAX) 400 MG tablet Take 1 tablet three times daily for 5 days as needed for outbreaks.  06/28/2020  acyclovir ointment (ZOVIRAX) 5 % Apply 1 application topically every 3 (three) hours. As needed for outbreaks.  Marland Kitchen CALCIUM PO Take 2 tablets by mouth daily.  . Multiple Vitamins-Minerals (ZINC PO) Take 1 tablet by mouth daily.  . nitrofurantoin, macrocrystal-monohydrate, (MACROBID) 100 MG capsule Take 1 capsule (100 mg total) by mouth 2 (two) times daily for 7 days.  . [DISCONTINUED] EPINEPHrine 0.3 mg/0.3 mL IJ SOAJ injection Inject 0.3 mLs (0.3 mg total) into the muscle as needed for anaphylaxis (ok to fill with epinephrine autoinjector).  . [DISCONTINUED] famotidine (PEPCID) 20 MG tablet Take 1 tablet (20 mg total) by mouth daily.  . [DISCONTINUED] loratadine  (CLARITIN) 10 MG tablet Take 1 tablet (10 mg total) by mouth daily.  . [DISCONTINUED] predniSONE (DELTASONE) 20 MG tablet Take 2 a day for 5 days, then 1 a day for 5 days, with food. Don't take with aleve/ibuprofen.   No facility-administered encounter medications on file as of 06/26/2020.

## 2020-06-28 LAB — URINE CULTURE
MICRO NUMBER:: 11366440
SPECIMEN QUALITY:: ADEQUATE

## 2020-07-24 DIAGNOSIS — Z1231 Encounter for screening mammogram for malignant neoplasm of breast: Secondary | ICD-10-CM

## 2020-07-24 DIAGNOSIS — E2839 Other primary ovarian failure: Secondary | ICD-10-CM

## 2020-08-02 DIAGNOSIS — M25541 Pain in joints of right hand: Secondary | ICD-10-CM | POA: Diagnosis not present

## 2020-08-02 DIAGNOSIS — M7711 Lateral epicondylitis, right elbow: Secondary | ICD-10-CM | POA: Diagnosis not present

## 2020-08-02 DIAGNOSIS — M25521 Pain in right elbow: Secondary | ICD-10-CM | POA: Diagnosis not present

## 2020-08-02 DIAGNOSIS — M1811 Unilateral primary osteoarthritis of first carpometacarpal joint, right hand: Secondary | ICD-10-CM | POA: Diagnosis not present

## 2020-08-14 ENCOUNTER — Telehealth: Payer: Self-pay | Admitting: Primary Care

## 2020-08-14 DIAGNOSIS — M8589 Other specified disorders of bone density and structure, multiple sites: Secondary | ICD-10-CM | POA: Diagnosis not present

## 2020-08-14 DIAGNOSIS — Z1231 Encounter for screening mammogram for malignant neoplasm of breast: Secondary | ICD-10-CM | POA: Diagnosis not present

## 2020-08-14 LAB — HM MAMMOGRAPHY

## 2020-08-14 LAB — HM DEXA SCAN

## 2020-08-14 NOTE — Telephone Encounter (Addendum)
-----   Message from Pleas Koch, NP sent at 07/25/2020  4:14 PM EST ----- Regarding: Has she scheduled an appointment? Check to see if she is scheduled an appointment.  I have not seen her since 2019.  I ordered a mammogram and bone density scan for her a few weeks ago and told her to schedule an appointment.

## 2020-08-14 NOTE — Telephone Encounter (Signed)
Can you call patient to set up app?

## 2020-08-15 NOTE — Telephone Encounter (Signed)
Called patient and LVM to schedule.

## 2020-08-19 ENCOUNTER — Encounter: Payer: Self-pay | Admitting: Primary Care

## 2020-08-19 NOTE — Telephone Encounter (Signed)
Called again to schedule OV. LVM to call back. Letter sent.

## 2020-08-23 ENCOUNTER — Encounter: Payer: Self-pay | Admitting: Primary Care

## 2020-09-04 ENCOUNTER — Encounter: Payer: BC Managed Care – PPO | Admitting: Primary Care

## 2021-01-16 ENCOUNTER — Encounter: Payer: Self-pay | Admitting: Primary Care

## 2021-01-16 ENCOUNTER — Other Ambulatory Visit (HOSPITAL_COMMUNITY)
Admission: RE | Admit: 2021-01-16 | Discharge: 2021-01-16 | Disposition: A | Discharge status: Home / Self Care | Payer: BC Managed Care – PPO | Source: Ambulatory Visit | Attending: Primary Care | Admitting: Primary Care

## 2021-01-16 ENCOUNTER — Ambulatory Visit (INDEPENDENT_AMBULATORY_CARE_PROVIDER_SITE_OTHER): Payer: BC Managed Care – PPO | Admitting: Primary Care

## 2021-01-16 ENCOUNTER — Other Ambulatory Visit: Payer: Self-pay

## 2021-01-16 VITALS — BP 120/74 | HR 78 | Temp 97.8°F | Ht 65.0 in | Wt 144.0 lb

## 2021-01-16 DIAGNOSIS — Z124 Encounter for screening for malignant neoplasm of cervix: Secondary | ICD-10-CM

## 2021-01-16 DIAGNOSIS — N952 Postmenopausal atrophic vaginitis: Secondary | ICD-10-CM | POA: Diagnosis not present

## 2021-01-16 DIAGNOSIS — Z1211 Encounter for screening for malignant neoplasm of colon: Secondary | ICD-10-CM

## 2021-01-16 DIAGNOSIS — A6000 Herpesviral infection of urogenital system, unspecified: Secondary | ICD-10-CM | POA: Diagnosis not present

## 2021-01-16 DIAGNOSIS — Z0000 Encounter for general adult medical examination without abnormal findings: Secondary | ICD-10-CM | POA: Diagnosis not present

## 2021-01-16 DIAGNOSIS — N9419 Other specified dyspareunia: Secondary | ICD-10-CM | POA: Diagnosis not present

## 2021-01-16 DIAGNOSIS — Z23 Encounter for immunization: Secondary | ICD-10-CM

## 2021-01-16 LAB — COMPREHENSIVE METABOLIC PANEL
ALT: 15 U/L (ref 0–35)
AST: 19 U/L (ref 0–37)
Albumin: 4.4 g/dL (ref 3.5–5.2)
Alkaline Phosphatase: 79 U/L (ref 39–117)
BUN: 20 mg/dL (ref 6–23)
CO2: 27 mEq/L (ref 19–32)
Calcium: 9.7 mg/dL (ref 8.4–10.5)
Chloride: 102 mEq/L (ref 96–112)
Creatinine, Ser: 0.83 mg/dL (ref 0.40–1.20)
GFR: 75.8 mL/min (ref >60.00)
Glucose, Bld: 86 mg/dL (ref 70–99)
Potassium: 4.1 mEq/L (ref 3.5–5.1)
Sodium: 137 mEq/L (ref 135–145)
Total Bilirubin: 0.5 mg/dL (ref 0.2–1.2)
Total Protein: 7.4 g/dL (ref 6.0–8.3)

## 2021-01-16 LAB — CBC
HCT: 40.9 % (ref 36.0–46.0)
Hemoglobin: 13.7 g/dL (ref 12.0–15.0)
MCHC: 33.6 g/dL (ref 30.0–36.0)
MCV: 93.3 fl (ref 78.0–100.0)
Platelets: 302 10*3/uL (ref 150.0–400.0)
RBC: 4.38 Mil/uL (ref 3.87–5.11)
RDW: 13.1 % (ref 11.5–15.5)
WBC: 8.2 10*3/uL (ref 4.0–10.5)

## 2021-01-16 LAB — LIPID PANEL
Cholesterol: 203 mg/dL — ABNORMAL HIGH (ref 0–200)
HDL: 121.4 mg/dL (ref >39.00)
LDL Cholesterol: 74 mg/dL (ref 0–99)
NonHDL: 81.8
Total CHOL/HDL Ratio: 2
Triglycerides: 39 mg/dL (ref 0.0–149.0)
VLDL: 7.8 mg/dL (ref 0.0–40.0)

## 2021-01-16 MED ORDER — ESTRADIOL 0.1 MG/GM VA CREA: 1.0000 | TOPICAL_CREAM | VAGINAL | 0 refills | Status: DC

## 2021-01-16 NOTE — Addendum Note (Signed)
Addended by: Francella Solian on: 01/16/2021 11:57 AM   Modules accepted: Orders

## 2021-01-16 NOTE — Patient Instructions (Signed)
Stop by the lab prior to leaving today. I will notify you of your results once received.   You will be contacted regarding your referral to GI for the colonoscopy.  Please let us know if you have not been contacted within two weeks.   Try the Estrace Cream, use 1 applicator three times weekly for dryness.  Please update me in 1-2 months.  Schedule a nurse visit for 2-6 months for the second Shingles vaccine.  It was a pleasure to see you today!  Preventive Care 62-34 Years Old, Female Preventive care refers to lifestyle choices and visits with your health care provider that can promote health and wellness. This includes: A yearly physical exam. This is also called an annual wellness visit. Regular dental and eye exams. Immunizations. Screening for certain conditions. Healthy lifestyle choices, such as: Eating a healthy diet. Getting regular exercise. Not using drugs or products that contain nicotine and tobacco. Limiting alcohol use. What can I expect for my preventive care visit? Physical exam Your health care provider will check your: Height and weight. These may be used to calculate your BMI (body mass index). BMI is a measurement that tells if you are at a healthy weight. Heart rate and blood pressure. Body temperature. Skin for abnormal spots. Counseling Your health care provider may ask you questions about your: Past medical problems. Family's medical history. Alcohol, tobacco, and drug use. Emotional well-being. Home life and relationship well-being. Sexual activity. Diet, exercise, and sleep habits. Work and work Statistician. Access to firearms. Method of birth control. Menstrual cycle. Pregnancy history. What immunizations do I need?  Vaccines are usually given at various ages, according to a schedule. Your health care provider will recommend vaccines for you based on your age, medicalhistory, and lifestyle or other factors, such as travel or where you  work. What tests do I need? Blood tests Lipid and cholesterol levels. These may be checked every 5 years, or more often if you are over 63 years old. Hepatitis C test. Hepatitis B test. Screening Lung cancer screening. You may have this screening every year starting at age 40 if you have a 30-pack-year history of smoking and currently smoke or have quit within the past 15 years. Colorectal cancer screening. All adults should have this screening starting at age 66 and continuing until age 56. Your health care provider may recommend screening at age 15 if you are at increased risk. You will have tests every 1-10 years, depending on your results and the type of screening test. Diabetes screening. This is done by checking your blood sugar (glucose) after you have not eaten for a while (fasting). You may have this done every 1-3 years. Mammogram. This may be done every 1-2 years. Talk with your health care provider about when you should start having regular mammograms. This may depend on whether you have a family history of breast cancer. BRCA-related cancer screening. This may be done if you have a family history of breast, ovarian, tubal, or peritoneal cancers. Pelvic exam and Pap test. This may be done every 3 years starting at age 66. Starting at age 93, this may be done every 5 years if you have a Pap test in combination with an HPV test. Other tests STD (sexually transmitted disease) testing, if you are at risk. Bone density scan. This is done to screen for osteoporosis. You may have this scan if you are at high risk for osteoporosis. Talk with your health care provider about your test results,  treatment options,and if necessary, the need for more tests. Follow these instructions at home: Eating and drinking  Eat a diet that includes fresh fruits and vegetables, whole grains, lean protein, and low-fat dairy products. Take vitamin and mineral supplements as recommended by your health  care provider. Do not drink alcohol if: Your health care provider tells you not to drink. You are pregnant, may be pregnant, or are planning to become pregnant. If you drink alcohol: Limit how much you have to 0-1 drink a day. Be aware of how much alcohol is in your drink. In the [place] one drink equals one 12 oz bottle of beer (355 mL), one 5 oz glass of wine (148 mL), or one 1 oz glass of hard liquor (44 mL).  Lifestyle Take daily care of your teeth and gums. Brush your teeth every morning and night with fluoride toothpaste. Floss one time each day. Stay active. Exercise for at least 30 minutes 5 or more days each week. Do not use any products that contain nicotine or tobacco, such as cigarettes, e-cigarettes, and chewing tobacco. If you need help quitting, ask your health care provider. Do not use drugs. If you are sexually active, practice safe sex. Use a condom or other form of protection to prevent STIs (sexually transmitted infections). If you do not wish to become pregnant, use a form of birth control. If you plan to become pregnant, see your health care provider for a prepregnancy visit. If told by your health care provider, take low-dose aspirin daily starting at age 70. Find healthy ways to cope with stress, such as: Meditation, yoga, or listening to music. Journaling. Talking to a trusted person. Spending time with friends and family. Safety Always wear your seat belt while driving or riding in a vehicle. Do not drive: If you have been drinking alcohol. Do not ride with someone who has been drinking. When you are tired or distracted. While texting. Wear a helmet and other protective equipment during sports activities. If you have firearms in your house, make sure you follow all gun safety procedures. What's next? Visit your health care provider once a year for an annual wellness visit. Ask your health care provider how often you should have your eyes and teeth  checked. Stay up to date on all vaccines. This information is not intended to replace advice given to you by your health care provider. Make sure you discuss any questions you have with your healthcare provider. Document Revised: 03/19/2020 Document Reviewed: 02/24/2018 Elsevier Patient Education  2022 [employer].

## 2021-01-16 NOTE — Assessment & Plan Note (Signed)
Noted on exam. Trial of Estrace cream sent to pharmacy.

## 2021-01-16 NOTE — Assessment & Plan Note (Signed)
Shingrix due, provided first dose today. Other vaccines UTD.  Pap smear due, completed today. Mammogram UTD. Colonoscopy UTD, due in 2025.  Commended her on regular exercise. Exam today stable. Labs pending.

## 2021-01-16 NOTE — Progress Notes (Signed)
Subjective:    Patient ID: Mary Gill, female    DOB: 06/20/1961, 60 y.o.   MRN: [id number]  HPI  Mary Gill is a very pleasant 60 y.o. female who presents today for complete physical and follow up of chronic conditions.  Immunizations: -Tetanus: 2019 -Influenza: Due this season  -Covid-19: 2 vaccines  -Shingles: Never completed   Diet: Fair diet.  Exercise: 3-5 days weekly, playing golf  Eye exam: Completes annually  Dental exam: Completes semi-annually   Pap Smear: Completed in July 2018 Mammogram: Completed in February 2022 Colonoscopy: Completed in 2015, due 2025  BP Readings from Last 3 Encounters:  01/16/21 120/74  06/26/20 140/84  02/06/20 122/70       Review of Systems  Constitutional:  Negative for unexpected weight change.  HENT:  Negative for rhinorrhea.   Respiratory:  Negative for shortness of breath.   Cardiovascular:  Negative for chest pain.  Gastrointestinal:  Negative for constipation and diarrhea.  Genitourinary:  Negative for difficulty urinating.  Musculoskeletal:  Negative for arthralgias and myalgias.  Skin:  Negative for rash.  Allergic/Immunologic: Negative for environmental allergies.  Neurological:  Negative for dizziness and headaches.  Psychiatric/Behavioral:  The patient is not nervous/anxious.         Past Medical History:  Diagnosis Date   Allergy    Blood transfusion    2005 after heart cath   Blood transfusion without reported diagnosis    CHF (congestive heart failure) ([place])    Myocardial infarction ([place]) 2005   Thyroid disease 2008   goiter    Social History   Socioeconomic History   Marital status: Married    Spouse name: Not on file   Number of children: Not on file   Years of education: Not on file   Highest education level: Not on file  Occupational History   Not on file  Tobacco Use   Smoking status: Never   Smokeless tobacco: Never  Vaping Use   Vaping Use: Never used  Substance and Sexual  Activity   Alcohol use: Yes    Alcohol/week: 4.0 standard drinks    Types: 4 Glasses of wine per week   Drug use: No   Sexual activity: Yes    Partners: Male    Birth control/protection: Post-menopausal  Other Topics Concern   Not on file  Social History Narrative   Married.   1 child. 1 grandchild.   Works at [employer].   Enjoys golfing, exercising, spending with friends.    Social Determinants of Health   Financial Resource Strain: Not on file  Food Insecurity: Not on file  Transportation Needs: Not on file  Physical Activity: Not on file  Stress: Not on file  Social Connections: Not on file  Intimate Partner Violence: Not on file    Past Surgical History:  Procedure Laterality Date   bilateral breast augmentation  1989   CARDIAC CATHETERIZATION  2005   after MI; bleed at point of entry; received 3 units blood   CATARACT EXTRACTION, BILATERAL  08/2017   CHOLECYSTECTOMY  2007   STAPEDECTOMY Bilateral 2000   TUBAL LIGATION  2004    Family History  Problem Relation Age of Onset   Alzheimer's disease Father    Breast cancer Maternal Aunt 54   Alzheimer's disease Paternal Aunt    Alzheimer's disease Paternal Uncle    Breast cancer Sister 26       07/2014   Colon cancer Neg Hx  Esophageal cancer Neg Hx    Stomach cancer Neg Hx    Rectal cancer Neg Hx     No Known Allergies  Current Outpatient Medications on File Prior to Visit  Medication Sig Dispense Refill   acyclovir (ZOVIRAX) 400 MG tablet Take 1 tablet three times daily for 5 days as needed for outbreaks. 30 tablet 0   acyclovir ointment (ZOVIRAX) 5 % Apply 1 application topically every 3 (three) hours. As needed for outbreaks. 15 g 0   CALCIUM PO Take 2 tablets by mouth daily.     Multiple Vitamins-Minerals (ZINC PO) Take 1 tablet by mouth daily.     No current facility-administered medications on file prior to visit.    BP 120/74   Pulse 78   Temp 97.8 F (36.6 C) (Temporal)   Ht 5\' 5"   (1.651 m)   Wt 144 lb (65.3 kg)   SpO2 98%   BMI 23.96 kg/m  Objective:   Physical Exam HENT:     Right Ear: Tympanic membrane and ear canal normal.     Left Ear: Tympanic membrane and ear canal normal.     Nose: Nose normal.  Eyes:     Conjunctiva/sclera: Conjunctivae normal.     Pupils: Pupils are equal, round, and reactive to light.  Neck:     Thyroid: No thyromegaly.  Cardiovascular:     Rate and Rhythm: Normal rate and regular rhythm.     Heart sounds: No murmur heard. Pulmonary:     Effort: Pulmonary effort is normal.     Breath sounds: Normal breath sounds. No rales.  Abdominal:     General: Bowel sounds are normal.     Palpations: Abdomen is soft.     Tenderness: There is no abdominal tenderness.  Genitourinary:    Labia:        Right: No tenderness or lesion.        Left: No tenderness or lesion.      Vagina: Tenderness present. No vaginal discharge or bleeding.     Cervix: No cervical motion tenderness, erythema or cervical bleeding.     Comments: Patient very uncomfortable during pap smear due to vaginal dryness.  Musculoskeletal:        General: Normal range of motion.     Cervical back: Neck supple.  Lymphadenopathy:     Cervical: No cervical adenopathy.  Skin:    General: Skin is warm and dry.     Findings: No rash.  Neurological:     Mental Status: She is alert and oriented to person, place, and time.     Cranial Nerves: No cranial nerve deficit.     Deep Tendon Reflexes: Reflexes are normal and symmetric.  Psychiatric:        Mood and Affect: Mood normal.          Assessment & Plan:      This visit occurred during the SARS-CoV-2 public health emergency.  Safety protocols were in place, including screening questions prior to the visit, additional usage of staff PPE, and extensive cleaning of exam room while observing appropriate contact time as indicated for disinfecting solutions.

## 2021-01-16 NOTE — Assessment & Plan Note (Signed)
Chronic, continued. Patient very uncomfortable during pap smear. Trial of Estrace cream sent to pharmacy. She will update.

## 2021-01-16 NOTE — Assessment & Plan Note (Signed)
Overall infrequent, 0-5 times annually, last outbreak was two months ago. Continue PRN acyclovir 400 mg.

## 2021-01-17 LAB — CYTOLOGY - PAP
Comment: NEGATIVE
Diagnosis: NEGATIVE
High risk HPV: NEGATIVE

## 2021-05-21 ENCOUNTER — Ambulatory Visit: Payer: BC Managed Care – PPO

## 2021-06-05 ENCOUNTER — Ambulatory Visit: Payer: BC Managed Care – PPO

## 2021-06-19 ENCOUNTER — Other Ambulatory Visit: Payer: Self-pay

## 2021-06-19 ENCOUNTER — Ambulatory Visit (INDEPENDENT_AMBULATORY_CARE_PROVIDER_SITE_OTHER): Payer: BC Managed Care – PPO

## 2021-06-19 DIAGNOSIS — Z23 Encounter for immunization: Secondary | ICD-10-CM

## 2021-06-19 NOTE — Progress Notes (Signed)
Per orders of Allie Bossier, NP, 2nd injection of shingrix given by Loreen Freud. Patient tolerated injection well.

## 2021-08-11 ENCOUNTER — Ambulatory Visit (INDEPENDENT_AMBULATORY_CARE_PROVIDER_SITE_OTHER): Payer: BC Managed Care – PPO | Admitting: Family Medicine

## 2021-08-11 ENCOUNTER — Encounter: Payer: Self-pay | Admitting: Family Medicine

## 2021-08-11 ENCOUNTER — Other Ambulatory Visit: Payer: Self-pay

## 2021-08-11 VITALS — BP 136/82 | HR 77 | Temp 97.6°F | Ht 65.0 in | Wt 150.4 lb

## 2021-08-11 DIAGNOSIS — S46212A Strain of muscle, fascia and tendon of other parts of biceps, left arm, initial encounter: Secondary | ICD-10-CM | POA: Diagnosis not present

## 2021-08-11 DIAGNOSIS — M7711 Lateral epicondylitis, right elbow: Secondary | ICD-10-CM | POA: Diagnosis not present

## 2021-08-11 NOTE — Progress Notes (Signed)
Mary Urey T. Vander Kueker, MD, [place] at [facility] [place], [address]  Phone: [phone]   FAX: [place] - 61 y.o. female   MRN [id number]   Date of Birth: 18-Jan-1961  Date: 08/11/2021   PCP: Pleas Koch, NP   Referral: Pleas Koch, NP  Chief Complaint  Patient presents with   Arm Pain    C/o R elbow pain and L arm pain from elbow to wrist and now also has pain in bicep.  R elbow pain started about 6 mos ado.  Lt lower arm pain started about 3 wks ago and bicep pain- 2 wks ago.     This visit occurred during the SARS-CoV-2 public health emergency.  Safety protocols were in place, including screening questions prior to the visit, additional usage of staff PPE, and extensive cleaning of exam room while observing appropriate contact time as indicated for disinfecting solutions.   Subjective:   Mary Gill presents with lateral elbow pain.  Length of symptoms: 6 months Hand effected: Right  Patient describes a dull ache on the lateral elbow. There is some translation in the proximal forearm and in the distal upper arm. It is painful to lift with the hand facing down and to lift with the thumb in an upright position. Supination is painful. Patient points to the lateral epicondyle as the point of maximal tenderness near ECRB.  No trauma.   No prior fractures or operative interventions in the effective hand.  R LE - wears a strap whe playing golf.  L L forearm as well.  Also strained her biceps.  Was doing 25, now down to 5.  She did not have any bruising, but she does detect a notable weakness when trying to do biceps exercises at the gym.  This was about 2 weeks ago.  Heat, ice, theragun.  Voltaren gel  Prior PT or HEP: none  Denies numbness or tingling. No significant neck or shoulder pain.   Review of Systems is noted in the HPI, as appropriate  Objective:   Blood pressure  136/82, pulse 77, temperature 97.6 F (36.4 C), temperature source Temporal, height 5\' 5"  (1.651 m), weight 150 lb 7 oz (68.2 kg), SpO2 100 %.  GEN: No acute distress; alert,appropriate. PULM: Breathing comfortably in no respiratory distress PSYCH: Normally interactive.   Elbow: R Ecchymosis or edema: neg ROM: full flexion, extension, pronation, supination Shoulder ROM: Full Flexion: 5/5 Extension: 5/5, PAINFUL Supination: 5/5, PAINFUL Pronation: 5/5 Wrist ext: 5/5 Wrist flexion: 5/5 No gross bony abnormality Varus and Valgus stress: stable ECRB tenderness: YES, TTP Medial epicondyle: NT Lateral epicondyle, resisted wrist extension from wrist full pronation and flexion: PAINFUL grip: 5/5  sensation intact Tinel's, Elbow: negative  Bilateral shoulders have full range of motion with strength 5/5  At the elbow on the left full extension 5/5 Flexion is 4+/5. I do think that I feel a palpable defect there is very tiny on lateral biceps, but the biceps tendon distally and proximally is clearly intact.  Subjective:     ICD-10-CM   1. Lateral epicondylitis of right elbow  M77.11 Ambulatory referral to Physical Therapy    2. Biceps strain, left, initial encounter  S46.212A      6 months of lateral epicondylitis.  This is a lot more challenging with this length of time.  Thing is she would benefit a lot from formal physical therapy.  Left-sided bicep  strain, I think that this will recover, physical therapy will help, but I encouraged her to go back to working out but starting on very low weights and progressing.  Elbow anatomy was reviewed, and tendinopathy was explained. Pt. given a formal rehab program from [facility] on elbow rehabiliation.  Start off with isometrics and gentle stretching and ROM progressing to a series of concentric and eccentric exercises should be done starting with no weight, work up to 1 lb, hammer, etc.  Use counterforce strap if working or using  hands.  Tendon Origin / Insertion Injection Procedure Note Mary Gill 17-Apr-1961 Date of procedure: 08/11/2021  Procedure: Tendon Origin / Insertion Injection for Lateral Epicondylitis, R Indications: Pain  Procedure Details Verbal consent was obtained from the patient. Risks, benefits, and alternatives were discussed. Potential complications including loss of pigment, atrophy were discussed. Prepped with Chloraprep and Ethyl Chloride used for anesthesia. Under sterile conditions, the patient was injected at the point of maximal tenderness at the ECRB tendon with 1/2 cc of Lidocaine 1% and 1/2 cc of Kenalog 40 mg. Decreased pain after injection. No complications.  Needle size: 22 gauge 1 1/2 inch Medication: 1/2 cc of Kenalog 40 mg (equaling Kenalog 20 mg)   Follow-up: 6 to 8 weeks  No orders of the defined types were placed in this encounter.  Orders Placed This Encounter  Procedures   Ambulatory referral to Physical Therapy    Signed,  Rylee Huestis T. Elease Swarm, MD   Patient's Medications  New Prescriptions   No medications on file  Previous Medications   ACYCLOVIR (ZOVIRAX) 400 MG TABLET    Take 1 tablet three times daily for 5 days as needed for outbreaks.   ACYCLOVIR OINTMENT (ZOVIRAX) 5 %    Apply 1 application topically every 3 (three) hours. As needed for outbreaks.   CALCIUM PO    Take 2 tablets by mouth daily.   MULTIPLE VITAMINS-MINERALS (ZINC PO)    Take 1 tablet by mouth daily.  Modified Medications   No medications on file  Discontinued Medications   ESTRADIOL (ESTRACE VAGINAL) 0.1 MG/GM VAGINAL CREAM    Place 1 Applicatorful vaginally 3 (three) times a week.

## 2021-08-12 ENCOUNTER — Encounter: Payer: Self-pay | Admitting: Family Medicine

## 2021-08-18 ENCOUNTER — Encounter: Payer: Self-pay | Admitting: *Deleted

## 2021-08-18 DIAGNOSIS — Z1231 Encounter for screening mammogram for malignant neoplasm of breast: Secondary | ICD-10-CM | POA: Diagnosis not present

## 2021-08-21 ENCOUNTER — Ambulatory Visit: Payer: BC Managed Care – PPO | Admitting: Family Medicine

## 2021-08-26 DIAGNOSIS — R922 Inconclusive mammogram: Secondary | ICD-10-CM | POA: Diagnosis not present

## 2021-08-26 LAB — HM MAMMOGRAPHY

## 2021-08-27 ENCOUNTER — Encounter: Payer: Self-pay | Admitting: Primary Care

## 2021-12-07 NOTE — Progress Notes (Unsigned)
    Mary Gill T. Mary Sabado, MD, Mary Gill at [facility] [place], [address]  Phone: [phone]  FAX: [place] - 61 y.o. female  MRN [id number]  Date of Birth: 1960-12-02  Date: 12/08/2021  PCP: Pleas Koch, NP  Referral: Pleas Koch, NP  No chief complaint on file.  Subjective:   Mary Gill is a 61 y.o. very pleasant female patient with There is no height or weight on file to calculate BMI. who presents with the following:  R LE, last seen 07/2021.  LE has been close to 10 months, and I did send her to formal PT the last time that I saw her.  Now she has a recurrence of symptoms and is here for additional evaluation.    Review of Systems is noted in the HPI, as appropriate   Objective:   There were no vitals taken for this visit.  ***  Radiology: No results found.  Assessment and Plan:   ***

## 2021-12-08 ENCOUNTER — Encounter: Payer: Self-pay | Admitting: Family Medicine

## 2021-12-08 ENCOUNTER — Ambulatory Visit (INDEPENDENT_AMBULATORY_CARE_PROVIDER_SITE_OTHER): Payer: BC Managed Care – PPO | Admitting: Family Medicine

## 2021-12-08 VITALS — BP 120/82 | HR 71 | Temp 98.3°F | Ht 65.0 in | Wt 151.4 lb

## 2021-12-08 DIAGNOSIS — S40021A Contusion of right upper arm, initial encounter: Secondary | ICD-10-CM | POA: Diagnosis not present

## 2021-12-08 DIAGNOSIS — M7711 Lateral epicondylitis, right elbow: Secondary | ICD-10-CM

## 2022-08-24 LAB — HM MAMMOGRAPHY

## 2022-11-02 DIAGNOSIS — Z0000 Encounter for general adult medical examination without abnormal findings: Secondary | ICD-10-CM

## 2022-11-05 ENCOUNTER — Other Ambulatory Visit: Payer: Managed Care, Other (non HMO)

## 2022-11-05 DIAGNOSIS — Z0000 Encounter for general adult medical examination without abnormal findings: Secondary | ICD-10-CM | POA: Diagnosis not present

## 2022-11-05 LAB — COMPREHENSIVE METABOLIC PANEL
ALT: 14 U/L (ref 0–35)
AST: 18 U/L (ref 0–37)
Albumin: 4.2 g/dL (ref 3.5–5.2)
Alkaline Phosphatase: 72 U/L (ref 39–117)
BUN: 19 mg/dL (ref 6–23)
CO2: 29 mEq/L (ref 19–32)
Calcium: 9.5 mg/dL (ref 8.4–10.5)
Chloride: 100 mEq/L (ref 96–112)
Creatinine, Ser: 0.8 mg/dL (ref 0.40–1.20)
GFR: 78.23 mL/min (ref >60.00)
Glucose, Bld: 81 mg/dL (ref 70–99)
Potassium: 4.4 mEq/L (ref 3.5–5.1)
Sodium: 137 mEq/L (ref 135–145)
Total Bilirubin: 0.5 mg/dL (ref 0.2–1.2)
Total Protein: 7.2 g/dL (ref 6.0–8.3)

## 2022-11-05 LAB — CBC
HCT: 39.9 % (ref 36.0–46.0)
Hemoglobin: 13.6 g/dL (ref 12.0–15.0)
MCHC: 34 g/dL (ref 30.0–36.0)
MCV: 93.9 fl (ref 78.0–100.0)
Platelets: 314 10*3/uL (ref 150.0–400.0)
RBC: 4.25 Mil/uL (ref 3.87–5.11)
RDW: 13.1 % (ref 11.5–15.5)
WBC: 8.7 10*3/uL (ref 4.0–10.5)

## 2022-11-05 LAB — LIPID PANEL
Cholesterol: 198 mg/dL (ref 0–200)
HDL: 109.6 mg/dL (ref >39.00)
LDL Cholesterol: 76 mg/dL (ref 0–99)
NonHDL: 88.27
Total CHOL/HDL Ratio: 2
Triglycerides: 59 mg/dL (ref 0.0–149.0)
VLDL: 11.8 mg/dL (ref 0.0–40.0)

## 2022-11-10 ENCOUNTER — Ambulatory Visit (INDEPENDENT_AMBULATORY_CARE_PROVIDER_SITE_OTHER): Payer: Managed Care, Other (non HMO) | Admitting: Primary Care

## 2022-11-10 ENCOUNTER — Encounter: Payer: Self-pay | Admitting: Primary Care

## 2022-11-10 VITALS — BP 116/60 | HR 75 | Temp 97.5°F | Ht 65.0 in | Wt 151.0 lb

## 2022-11-10 DIAGNOSIS — Z0001 Encounter for general adult medical examination with abnormal findings: Secondary | ICD-10-CM | POA: Diagnosis not present

## 2022-11-10 DIAGNOSIS — A6000 Herpesviral infection of urogenital system, unspecified: Secondary | ICD-10-CM

## 2022-11-10 DIAGNOSIS — K529 Noninfective gastroenteritis and colitis, unspecified: Secondary | ICD-10-CM

## 2022-11-10 MED ORDER — ACYCLOVIR 5 % EX OINT: 1.0000 | TOPICAL_OINTMENT | CUTANEOUS | 0 refills | Status: AC

## 2022-11-10 MED ORDER — ACYCLOVIR 400 MG PO TABS: ORAL_TABLET | ORAL | 0 refills | Status: AC

## 2022-11-10 MED ORDER — CHOLESTYRAMINE 4 G PO PACK: 2.0000 g | PACK | Freq: Three times a day (TID) | ORAL | 0 refills | Status: AC

## 2022-11-10 NOTE — Assessment & Plan Note (Signed)
Infrequent outbreaks.  Continue acyclovir 400 mg TID PRN and acyclovir 5% ointment PRN.

## 2022-11-10 NOTE — Progress Notes (Signed)
Subjective:    Patient ID: Mary Gill, female    DOB: 1960-11-05, 62 y.o.   MRN: [id number]  HPI  Mary Gill is a very pleasant 62 y.o. female who presents today for complete physical and follow up of chronic conditions.  She would also like to discuss ongoing gas. History of gall bladder removal about 17 years ago. Since then she's noticed intermittent, liquid diarrhea. About 5 yeas ago she developed frequent episodes of gas. Episodes occur nearly everyday, with or without a meal. She eats a healthy diet and is exercising regularly. She avoids greasy foods, gluten, spicy foods. She is interested in a treatment recommended by a friend.   Immunizations: -Tetanus: Completed in 2019 -Shingles: Completed Shingrix series  Diet: Fair diet.  Exercise: 3-4 days weekly   Eye exam: Completes annually  Dental exam: Completes semi-annually    Pap Smear: July 2022 Mammogram: February 2024  Colonoscopy: Completed in 2015, due 2025   BP Readings from Last 3 Encounters:  11/10/22 116/60  12/08/21 120/82  08/11/21 136/82   Wt Readings from Last 3 Encounters:  11/10/22 151 lb (68.5 kg)  12/08/21 151 lb 6 oz (68.7 kg)  08/11/21 150 lb 7 oz (68.2 kg)      Review of Systems  Constitutional:  Negative for unexpected weight change.  HENT:  Negative for rhinorrhea.   Respiratory:  Negative for cough and shortness of breath.   Cardiovascular:  Negative for chest pain.  Gastrointestinal:  Positive for diarrhea. Negative for constipation.       Chronic flatulence   Genitourinary:  Negative for difficulty urinating.  Musculoskeletal:  Positive for arthralgias.  Skin:  Negative for rash.  Allergic/Immunologic: Negative for environmental allergies.  Neurological:  Negative for dizziness and headaches.  Psychiatric/Behavioral:  The patient is not nervous/anxious.          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   Blood transfusion     2005 after heart cath   Blood transfusion without reported diagnosis    Cataract 2019   Implants   CHF (congestive heart failure) ([facility])    Myocardial infarction ([facility]) 06/30/2003   Thyroid disease 06/29/2006   goiter    Social History   Socioeconomic History   Marital status: Married    Spouse name: Not on file   Number of children: Not on file   Years of education: Not on file   Highest education level: Not on file  Occupational History   Not on file  Tobacco Use   Smoking status: Never   Smokeless tobacco: Never  Vaping Use   Vaping Use: Never used  Substance and Sexual Activity   Alcohol use: Yes    Alcohol/week: 2.0 standard drinks of alcohol    Types: 2 Glasses of wine per week   Drug use: No   Sexual activity: Not Currently    Partners: Male    Birth control/protection: Post-menopausal  Other Topics Concern   Not on file  Social History Narrative   Married.   1 child. 1 grandchild.   Works at [employer].   Enjoys golfing, exercising, spending with friends.    Social Determinants of Health   Financial Resource Strain: Not on file  Food Insecurity: Not on file  Transportation Needs: Not on file  Physical Activity: Not on file  Stress: Not on file  Social Connections: Not on file  Intimate Partner Violence: Not  on file    Past Surgical History:  Procedure Laterality Date   bilateral breast augmentation  1989   BREAST SURGERY     See above   CARDIAC CATHETERIZATION  2005   after MI; bleed at point of entry; received 3 units blood   CATARACT EXTRACTION, BILATERAL  08/2017   CHOLECYSTECTOMY  2007   COSMETIC SURGERY  1995   Breast   EYE SURGERY  2019   Cataract   STAPEDECTOMY Bilateral 2000   TUBAL LIGATION  2004    Family History  Problem Relation Age of Onset   Alzheimer's disease Father    Breast cancer Maternal Aunt 77   Alzheimer's disease Paternal Aunt    Alzheimer's disease Paternal Uncle    Hearing loss Mother    Breast cancer  Sister 61       07/2014   Cancer Maternal Aunt    Cancer Paternal Aunt    Cancer Sister    Hearing loss Sister    Hearing loss Brother    Colon cancer Neg Hx    Esophageal cancer Neg Hx    Stomach cancer Neg Hx    Rectal cancer Neg Hx     No Known Allergies  Current Outpatient Medications on File Prior to Visit  Medication Sig Dispense Refill   CALCIUM PO Take 2 tablets by mouth daily.     Multiple Vitamins-Minerals (ZINC PO) Take 1 tablet by mouth daily.     No current facility-administered medications on file prior to visit.    BP 116/60   Pulse 75   Temp (!) 97.5 F (36.4 C) (Temporal)   Ht 5\' 5"  (1.651 m)   Wt 151 lb (68.5 kg)   SpO2 97%   BMI 25.13 kg/m  Objective:   Physical Exam HENT:     Right Ear: Tympanic membrane and ear canal normal.     Left Ear: Tympanic membrane and ear canal normal.     Nose: Nose normal.  Eyes:     Conjunctiva/sclera: Conjunctivae normal.     Pupils: Pupils are equal, round, and reactive to light.  Neck:     Thyroid: No thyromegaly.  Cardiovascular:     Rate and Rhythm: Normal rate and regular rhythm.     Heart sounds: No murmur heard. Pulmonary:     Effort: Pulmonary effort is normal.     Breath sounds: Normal breath sounds. No rales.  Abdominal:     General: Bowel sounds are normal.     Palpations: Abdomen is soft.     Tenderness: There is no abdominal tenderness.  Musculoskeletal:        General: Normal range of motion.     Cervical back: Neck supple.  Lymphadenopathy:     Cervical: No cervical adenopathy.  Skin:    General: Skin is warm and dry.     Findings: No rash.  Neurological:     Mental Status: She is alert and oriented to person, place, and time.     Cranial Nerves: No cranial nerve deficit.     Deep Tendon Reflexes: Reflexes are normal and symmetric.  Psychiatric:        Mood and Affect: Mood normal.           Assessment & Plan:  Chronic diarrhea Assessment & Plan: Likely secondary to  cholecystectomy. No alarm signs. Colonoscopy due 2025.  Trial of Questran 2-4 mg TID with meals. Prescription sent to pharmacy. She will update.   Orders: -  Cholestyramine; Take 0.5-1 packets (2-4 g total) by mouth 3 (three) times daily with meals.  Dispense: 300 each; Refill: 0  Genital herpes simplex, unspecified site Assessment & Plan: Infrequent outbreaks.  Continue acyclovir 400 mg TID PRN and acyclovir 5% ointment PRN.  Orders: -     Acyclovir; Take 1 tablet three times daily for 5 days as needed for outbreaks.  Dispense: 30 tablet; Refill: 0 -     Acyclovir; Apply 1 Application topically every 3 (three) hours. As needed for outbreaks.  Dispense: 15 g; Refill: 0  Encounter for annual general medical examination with abnormal findings in adult Assessment & Plan: Immunizations UTD. Pap smear UTD. Mammogram UTD Colonoscopy UTD, due 2025  Discussed the importance of a healthy diet and regular exercise in order for weight loss, and to reduce the risk of further co-morbidity.  Exam stable. Labs reviewed.  Follow up in 1 year for repeat physical.          Doreene Nest, NP

## 2022-11-10 NOTE — Assessment & Plan Note (Signed)
Immunizations UTD. Pap smear UTD. Mammogram UTD Colonoscopy UTD, due 2025  Discussed the importance of a healthy diet and regular exercise in order for weight loss, and to reduce the risk of further co-morbidity.  Exam stable. Labs reviewed.  Follow up in 1 year for repeat physical.

## 2022-11-10 NOTE — Patient Instructions (Addendum)
You can take cholestyramine (Questran) for gas/diarrhea. Take 2-4 grams once daily to start. Increase frequency based on tolerability and as needed.  It was a pleasure to see you today!

## 2022-11-10 NOTE — Assessment & Plan Note (Signed)
Likely secondary to cholecystectomy. No alarm signs. Colonoscopy due 2025.  Trial of Questran 2-4 mg TID with meals. Prescription sent to pharmacy. She will update.

## 2022-11-19 ENCOUNTER — Telehealth: Payer: Self-pay

## 2022-11-19 NOTE — Telephone Encounter (Signed)
Called and spoke with patient, advised of Mary Gill message. Patient requested refill be sent in for the acyclovir to the pharmacy. Patient still has pills left but would lie a refill on file.

## 2022-11-19 NOTE — Telephone Encounter (Signed)
Received PA via Covermymeds; KEY: [id number].  Must try 2 formulary alternatives: generic acyclovir (200 mg capsules, 400 mg and 800 mg tablets, and oral suspension [200 mg/5 ml]), generic famciclovir (125 mg, 250 mg and 500 mg tablets) and generic valacyclovir (500 mg and 1000 mg tablets)

## 2022-11-19 NOTE — Telephone Encounter (Signed)
Yes, I refilled this medication on 11/10/2022.  Have her check with her pharmacy.

## 2022-11-19 NOTE — Telephone Encounter (Signed)
Noted.  Please advise patient that her insurance will not cover the acyclovir ointment, but they should cover the acyclovir pills.  PA completed by pharmacy team.

## 2022-11-20 NOTE — Telephone Encounter (Signed)
Called and left message per dpr advising patient of Isabella Stalling message.

## 2023-10-04 DIAGNOSIS — Z1231 Encounter for screening mammogram for malignant neoplasm of breast: Secondary | ICD-10-CM | POA: Diagnosis not present

## 2023-10-04 LAB — HM MAMMOGRAPHY

## 2024-03-20 ENCOUNTER — Encounter: Payer: Self-pay | Admitting: Podiatry

## 2024-03-20 ENCOUNTER — Ambulatory Visit (INDEPENDENT_AMBULATORY_CARE_PROVIDER_SITE_OTHER): Admitting: Podiatry

## 2024-03-20 ENCOUNTER — Encounter

## 2024-03-20 DIAGNOSIS — M2041 Other hammer toe(s) (acquired), right foot: Secondary | ICD-10-CM | POA: Diagnosis not present

## 2024-03-20 DIAGNOSIS — M19071 Primary osteoarthritis, right ankle and foot: Secondary | ICD-10-CM | POA: Diagnosis not present

## 2024-03-20 DIAGNOSIS — M2011 Hallux valgus (acquired), right foot: Secondary | ICD-10-CM

## 2024-03-20 NOTE — Progress Notes (Signed)
 Subjective:  Patient ID: Mary Gill, female    DOB: 07-17-1960,  MRN: [id number] HPI Chief Complaint  Patient presents with   Bunions   Hammer Toe    Rn6 Right foot bunion and hammertoe 2nd toe right/ several months and sre starting to cross over each other.    63 y.o. female presents with the above complaint.   ROS: Denies fever chills nausea mobic muscle aches pains calf pain back pain chest pain shortness of breath.  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   Blood transfusion    2005 after heart cath   Blood transfusion without reported diagnosis    Cataract 2019   Implants   CHF (congestive heart failure) (HCC)    Myocardial infarction (HCC) 06/30/2003   Thyroid  disease 06/29/2006   goiter   Past Surgical History:  Procedure Laterality Date   bilateral breast augmentation  1989   BREAST SURGERY     See above   CARDIAC CATHETERIZATION  2005   after MI; bleed at point of entry; received 3 units blood   CATARACT EXTRACTION, BILATERAL  08/2017   CHOLECYSTECTOMY  2007   COSMETIC SURGERY  1995   Breast   EYE SURGERY  2019   Cataract   STAPEDECTOMY Bilateral 2000   TUBAL LIGATION  2004    Current Outpatient Medications:    acyclovir  (ZOVIRAX ) 400 MG tablet, Take 1 tablet three times daily for 5 days as needed for outbreaks., Disp: 30 tablet, Rfl: 0   acyclovir  ointment (ZOVIRAX ) 5 %, Apply 1 Application topically every 3 (three) hours. As needed for outbreaks., Disp: 15 g, Rfl: 0   CALCIUM PO, Take 2 tablets by mouth daily., Disp: , Rfl:    Probiotic Product (ALIGN) 10 MG CAPS, , Disp: , Rfl:    cholestyramine  (QUESTRAN ) 4 g packet, Take 0.5-1 packets (2-4 g total) by mouth 3 (three) times daily with meals., Disp: 300 each, Rfl: 0  No Known Allergies Review of Systems Objective:  There were no vitals filed for this visit.  General: Well developed, nourished, in no acute distress, alert and oriented x3    Dermatological: Skin is warm, dry and supple bilateral. Nails x 10 are well maintained; remaining integument appears unremarkable at this time. There are no open sores, no preulcerative lesions, no rash or signs of infection present.  Vascular: Dorsalis Pedis artery and Posterior Tibial artery pedal pulses are 2/4 bilateral with immedate capillary fill time. Pedal hair growth present. No varicosities and no lower extremity edema present bilateral.   Neruologic: Grossly intact via light touch bilateral. Vibratory intact via tuning fork bilateral. Protective threshold with Semmes Wienstein monofilament intact to all pedal sites bilateral. Patellar and Achilles deep tendon reflexes 2+ bilateral. No Babinski or clonus noted bilateral.   Musculoskeletal: No gross boney pedal deformities bilateral. No pain, crepitus, or limitation noted with foot and ankle range of motion bilateral. Muscular strength 5/5 in all groups tested bilateral.  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  She does have considerable valgus rotation of the toe as well.  Hammertoe deformity second right resulting in some tenderness of the second metatarsal phalangeal joint plantarly with some benign callus development.  Gait: Unassisted, Nonantalgic.    Radiographs:  Radiographs taken today demonstrate osseously mature individual hide increased in the first intermetatarsal angle greater than 15 degrees.  Hallux abductus angle  is greater than normal.  Large dorsal condyle to the head of the first metatarsal is noted.  Hammertoe deformity second is noted.  Mild to moderate osteopenia.  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  Plan: Discussed [place] plasty and the possible hammertoe procedure second right.  Discussed this in great detail referred  her to Dr. Silva.     Mary Gill, [place]

## 2024-04-24 ENCOUNTER — Ambulatory Visit: Admitting: Podiatry

## 2024-04-24 DIAGNOSIS — Z91199 Patient's noncompliance with other medical treatment and regimen due to unspecified reason: Secondary | ICD-10-CM

## 2024-04-26 NOTE — Progress Notes (Deleted)
 Patient was no-show for appointment today

## 2024-05-02 NOTE — Addendum Note (Signed)
 Addended byBETHA MEDICINE, Nattaly Yebra R on: 05/02/2024 08:23 AM   Modules accepted: Level of Service

## 2024-05-18 DIAGNOSIS — M9902 Segmental and somatic dysfunction of thoracic region: Secondary | ICD-10-CM | POA: Diagnosis not present

## 2024-05-18 DIAGNOSIS — M542 Cervicalgia: Secondary | ICD-10-CM | POA: Diagnosis not present

## 2024-05-18 DIAGNOSIS — M9901 Segmental and somatic dysfunction of cervical region: Secondary | ICD-10-CM | POA: Diagnosis not present

## 2024-05-18 DIAGNOSIS — M9906 Segmental and somatic dysfunction of lower extremity: Secondary | ICD-10-CM | POA: Diagnosis not present

## 2024-05-19 DIAGNOSIS — M542 Cervicalgia: Secondary | ICD-10-CM | POA: Diagnosis not present

## 2024-05-19 DIAGNOSIS — M9902 Segmental and somatic dysfunction of thoracic region: Secondary | ICD-10-CM | POA: Diagnosis not present

## 2024-05-19 DIAGNOSIS — M9906 Segmental and somatic dysfunction of lower extremity: Secondary | ICD-10-CM | POA: Diagnosis not present

## 2024-05-19 DIAGNOSIS — M9901 Segmental and somatic dysfunction of cervical region: Secondary | ICD-10-CM | POA: Diagnosis not present

## 2024-05-23 DIAGNOSIS — M542 Cervicalgia: Secondary | ICD-10-CM | POA: Diagnosis not present

## 2024-05-23 DIAGNOSIS — M9906 Segmental and somatic dysfunction of lower extremity: Secondary | ICD-10-CM | POA: Diagnosis not present

## 2024-05-23 DIAGNOSIS — M9901 Segmental and somatic dysfunction of cervical region: Secondary | ICD-10-CM | POA: Diagnosis not present

## 2024-05-23 DIAGNOSIS — M9902 Segmental and somatic dysfunction of thoracic region: Secondary | ICD-10-CM | POA: Diagnosis not present

## 2024-05-29 DIAGNOSIS — M9906 Segmental and somatic dysfunction of lower extremity: Secondary | ICD-10-CM | POA: Diagnosis not present

## 2024-05-29 DIAGNOSIS — M9902 Segmental and somatic dysfunction of thoracic region: Secondary | ICD-10-CM | POA: Diagnosis not present

## 2024-05-29 DIAGNOSIS — M542 Cervicalgia: Secondary | ICD-10-CM | POA: Diagnosis not present

## 2024-05-29 DIAGNOSIS — M9901 Segmental and somatic dysfunction of cervical region: Secondary | ICD-10-CM | POA: Diagnosis not present

## 2024-06-01 DIAGNOSIS — M9902 Segmental and somatic dysfunction of thoracic region: Secondary | ICD-10-CM | POA: Diagnosis not present

## 2024-06-01 DIAGNOSIS — M9901 Segmental and somatic dysfunction of cervical region: Secondary | ICD-10-CM | POA: Diagnosis not present

## 2024-06-01 DIAGNOSIS — M9906 Segmental and somatic dysfunction of lower extremity: Secondary | ICD-10-CM | POA: Diagnosis not present

## 2024-06-01 DIAGNOSIS — M542 Cervicalgia: Secondary | ICD-10-CM | POA: Diagnosis not present

## 2024-06-02 DIAGNOSIS — M9901 Segmental and somatic dysfunction of cervical region: Secondary | ICD-10-CM | POA: Diagnosis not present

## 2024-06-02 DIAGNOSIS — M542 Cervicalgia: Secondary | ICD-10-CM | POA: Diagnosis not present

## 2024-06-02 DIAGNOSIS — M9902 Segmental and somatic dysfunction of thoracic region: Secondary | ICD-10-CM | POA: Diagnosis not present

## 2024-06-02 DIAGNOSIS — M9906 Segmental and somatic dysfunction of lower extremity: Secondary | ICD-10-CM | POA: Diagnosis not present

## 2024-06-06 DIAGNOSIS — M9906 Segmental and somatic dysfunction of lower extremity: Secondary | ICD-10-CM | POA: Diagnosis not present

## 2024-06-06 DIAGNOSIS — M9902 Segmental and somatic dysfunction of thoracic region: Secondary | ICD-10-CM | POA: Diagnosis not present

## 2024-06-06 DIAGNOSIS — M542 Cervicalgia: Secondary | ICD-10-CM | POA: Diagnosis not present

## 2024-06-06 DIAGNOSIS — M9901 Segmental and somatic dysfunction of cervical region: Secondary | ICD-10-CM | POA: Diagnosis not present

## 2024-06-07 DIAGNOSIS — M9901 Segmental and somatic dysfunction of cervical region: Secondary | ICD-10-CM | POA: Diagnosis not present

## 2024-06-07 DIAGNOSIS — M9902 Segmental and somatic dysfunction of thoracic region: Secondary | ICD-10-CM | POA: Diagnosis not present

## 2024-06-07 DIAGNOSIS — M542 Cervicalgia: Secondary | ICD-10-CM | POA: Diagnosis not present

## 2024-06-07 DIAGNOSIS — M9906 Segmental and somatic dysfunction of lower extremity: Secondary | ICD-10-CM | POA: Diagnosis not present

## 2024-06-08 DIAGNOSIS — M542 Cervicalgia: Secondary | ICD-10-CM | POA: Diagnosis not present

## 2024-06-08 DIAGNOSIS — M9901 Segmental and somatic dysfunction of cervical region: Secondary | ICD-10-CM | POA: Diagnosis not present

## 2024-06-08 DIAGNOSIS — M9902 Segmental and somatic dysfunction of thoracic region: Secondary | ICD-10-CM | POA: Diagnosis not present

## 2024-06-08 DIAGNOSIS — M9906 Segmental and somatic dysfunction of lower extremity: Secondary | ICD-10-CM | POA: Diagnosis not present

## 2024-06-12 DIAGNOSIS — M9902 Segmental and somatic dysfunction of thoracic region: Secondary | ICD-10-CM | POA: Diagnosis not present

## 2024-06-12 DIAGNOSIS — M542 Cervicalgia: Secondary | ICD-10-CM | POA: Diagnosis not present

## 2024-06-12 DIAGNOSIS — M9906 Segmental and somatic dysfunction of lower extremity: Secondary | ICD-10-CM | POA: Diagnosis not present

## 2024-06-12 DIAGNOSIS — M9901 Segmental and somatic dysfunction of cervical region: Secondary | ICD-10-CM | POA: Diagnosis not present
